# Patient Record
Sex: Male | Born: 1955 | Race: White | Hispanic: No | Marital: Married | Smoking: Current every day smoker
Health system: Southern US, Community
[De-identification: ages and names within clinical notes are randomized; demographics above are authoritative.]

## PROBLEM LIST (undated history)

## (undated) DIAGNOSIS — R972 Elevated prostate specific antigen [PSA]: Secondary | ICD-10-CM

## (undated) DIAGNOSIS — K589 Irritable bowel syndrome without diarrhea: Secondary | ICD-10-CM

## (undated) DIAGNOSIS — E785 Hyperlipidemia, unspecified: Secondary | ICD-10-CM

## (undated) DIAGNOSIS — G8929 Other chronic pain: Secondary | ICD-10-CM

## (undated) DIAGNOSIS — M549 Dorsalgia, unspecified: Secondary | ICD-10-CM

## (undated) DIAGNOSIS — C61 Malignant neoplasm of prostate: Secondary | ICD-10-CM

## (undated) DIAGNOSIS — K219 Gastro-esophageal reflux disease without esophagitis: Secondary | ICD-10-CM

## (undated) DIAGNOSIS — K2270 Barrett's esophagus without dysplasia: Secondary | ICD-10-CM

## (undated) DIAGNOSIS — C801 Malignant (primary) neoplasm, unspecified: Secondary | ICD-10-CM

## (undated) DIAGNOSIS — C679 Malignant neoplasm of bladder, unspecified: Secondary | ICD-10-CM

## (undated) HISTORY — PX: OTHER SURGICAL HISTORY: SHX169

## (undated) HISTORY — DX: Malignant neoplasm of bladder, unspecified: C67.9

## (undated) HISTORY — PX: TONSILLECTOMY AND ADENOIDECTOMY: SUR1326

---

## 2002-03-01 HISTORY — PX: OTHER SURGICAL HISTORY: SHX169

## 2013-04-25 DIAGNOSIS — M5430 Sciatica, unspecified side: Secondary | ICD-10-CM | POA: Insufficient documentation

## 2013-04-25 DIAGNOSIS — R12 Heartburn: Secondary | ICD-10-CM | POA: Insufficient documentation

## 2013-05-15 DIAGNOSIS — F17200 Nicotine dependence, unspecified, uncomplicated: Secondary | ICD-10-CM | POA: Insufficient documentation

## 2014-03-01 DIAGNOSIS — C679 Malignant neoplasm of bladder, unspecified: Secondary | ICD-10-CM

## 2014-03-01 HISTORY — DX: Malignant neoplasm of bladder, unspecified: C67.9

## 2015-01-27 ENCOUNTER — Emergency Department (HOSPITAL_COMMUNITY)
Admission: EM | Admit: 2015-01-27 | Discharge: 2015-01-27 | Disposition: A | Discharge status: Home / Self Care | Payer: 59 | Attending: Emergency Medicine | Admitting: Emergency Medicine

## 2015-01-27 ENCOUNTER — Encounter (HOSPITAL_COMMUNITY): Payer: Self-pay | Admitting: *Deleted

## 2015-01-27 ENCOUNTER — Other Ambulatory Visit: Payer: Self-pay | Admitting: Urology

## 2015-01-27 DIAGNOSIS — Z79899 Other long term (current) drug therapy: Secondary | ICD-10-CM | POA: Insufficient documentation

## 2015-01-27 DIAGNOSIS — E785 Hyperlipidemia, unspecified: Secondary | ICD-10-CM | POA: Insufficient documentation

## 2015-01-27 DIAGNOSIS — Z7982 Long term (current) use of aspirin: Secondary | ICD-10-CM | POA: Insufficient documentation

## 2015-01-27 DIAGNOSIS — Z8719 Personal history of other diseases of the digestive system: Secondary | ICD-10-CM | POA: Insufficient documentation

## 2015-01-27 DIAGNOSIS — G8929 Other chronic pain: Secondary | ICD-10-CM | POA: Insufficient documentation

## 2015-01-27 DIAGNOSIS — M545 Low back pain: Secondary | ICD-10-CM | POA: Diagnosis not present

## 2015-01-27 DIAGNOSIS — R1030 Lower abdominal pain, unspecified: Secondary | ICD-10-CM | POA: Diagnosis not present

## 2015-01-27 DIAGNOSIS — F17210 Nicotine dependence, cigarettes, uncomplicated: Secondary | ICD-10-CM | POA: Insufficient documentation

## 2015-01-27 DIAGNOSIS — R319 Hematuria, unspecified: Secondary | ICD-10-CM | POA: Insufficient documentation

## 2015-01-27 HISTORY — DX: Irritable bowel syndrome, unspecified: K58.9

## 2015-01-27 HISTORY — DX: Other chronic pain: G89.29

## 2015-01-27 HISTORY — DX: Barrett's esophagus without dysplasia: K22.70

## 2015-01-27 HISTORY — DX: Hyperlipidemia, unspecified: E78.5

## 2015-01-27 HISTORY — DX: Dorsalgia, unspecified: M54.9

## 2015-01-27 LAB — COMPREHENSIVE METABOLIC PANEL
ALK PHOS: 53 U/L (ref 38–126)
ALT: 23 U/L (ref 17–63)
ANION GAP: 10 (ref 5–15)
AST: 20 U/L (ref 15–41)
Albumin: 4.9 g/dL (ref 3.5–5.0)
BUN: 12 mg/dL (ref 6–20)
CALCIUM: 9.9 mg/dL (ref 8.9–10.3)
CO2: 26 mmol/L (ref 22–32)
Chloride: 100 mmol/L — ABNORMAL LOW (ref 101–111)
Creatinine, Ser: 1.33 mg/dL — ABNORMAL HIGH (ref 0.61–1.24)
GFR calc non Af Amer: 57 mL/min — ABNORMAL LOW (ref >60)
Glucose, Bld: 102 mg/dL — ABNORMAL HIGH (ref 65–99)
POTASSIUM: 4.1 mmol/L (ref 3.5–5.1)
SODIUM: 136 mmol/L (ref 135–145)
Total Bilirubin: 0.7 mg/dL (ref 0.3–1.2)
Total Protein: 7.9 g/dL (ref 6.5–8.1)

## 2015-01-27 LAB — URINE MICROSCOPIC-ADD ON

## 2015-01-27 LAB — CBC WITH DIFFERENTIAL/PLATELET
BASOS PCT: 0 %
Basophils Absolute: 0 10*3/uL (ref 0.0–0.1)
EOS ABS: 0.2 10*3/uL (ref 0.0–0.7)
Eosinophils Relative: 2 %
HCT: 54.4 % — ABNORMAL HIGH (ref 39.0–52.0)
HEMOGLOBIN: 20 g/dL — AB (ref 13.0–17.0)
LYMPHS ABS: 1.7 10*3/uL (ref 0.7–4.0)
Lymphocytes Relative: 20 %
MCH: 33.2 pg (ref 26.0–34.0)
MCHC: 36.8 g/dL — AB (ref 30.0–36.0)
MCV: 90.4 fL (ref 78.0–100.0)
MONO ABS: 0.5 10*3/uL (ref 0.1–1.0)
Monocytes Relative: 6 %
NEUTROS ABS: 6.1 10*3/uL (ref 1.7–7.7)
Neutrophils Relative %: 72 %
PLATELETS: 206 10*3/uL (ref 150–400)
RBC: 6.02 MIL/uL — ABNORMAL HIGH (ref 4.22–5.81)
RDW: 13.1 % (ref 11.5–15.5)
WBC: 8.5 10*3/uL (ref 4.0–10.5)

## 2015-01-27 LAB — URINALYSIS, ROUTINE W REFLEX MICROSCOPIC
BILIRUBIN URINE: NEGATIVE
GLUCOSE, UA: 100 mg/dL — AB
KETONES UR: 15 mg/dL — AB
Nitrite: NEGATIVE
PH: 7 (ref 5.0–8.0)
Specific Gravity, Urine: 1.022 (ref 1.005–1.030)

## 2015-01-27 LAB — LIPASE, BLOOD: Lipase: 32 U/L (ref 11–51)

## 2015-01-27 NOTE — Discharge Instructions (Signed)
1. Medications: usual home medications 2. Treatment: rest, drink plenty of fluids 3. Follow Up: please followup with urology today (go right to [facility] office after discharge) for discussion of your diagnoses and further evaluation after today's visit; please return to the ER for severe pain, persistent bleeding, unable to urinate, new or worsening symptoms   Hematuria, Adult Hematuria is blood in your urine. It can be caused by a bladder infection, kidney infection, prostate infection, kidney stone, or cancer of your urinary tract. Infections can usually be treated with medicine, and a kidney stone usually will pass through your urine. If neither of these is the cause of your hematuria, further workup to find out the reason may be needed. It is very important that you tell your health care provider about any blood you see in your urine, even if the blood stops without treatment or happens without causing pain. Blood in your urine that happens and then stops and then happens again can be a symptom of a very serious condition. Also, pain is not a symptom in the initial stages of many urinary cancers. HOME CARE INSTRUCTIONS   Drink lots of fluid, 3-4 quarts a day. If you have been diagnosed with an infection, cranberry juice is especially recommended, in addition to large amounts of water.  Avoid caffeine, tea, and carbonated beverages because they tend to irritate the bladder.  Avoid alcohol because it may irritate the prostate.  Take all medicines as directed by your health care provider.  If you were prescribed an antibiotic medicine, finish it all even if you start to feel better.  If you have been diagnosed with a kidney stone, follow your health care provider's instructions regarding straining your urine to catch the stone.  Empty your bladder often. Avoid holding urine for long periods of time.  After a bowel movement, women should cleanse front to back. Use each tissue only  once.  Empty your bladder before and after sexual intercourse if you are a male. SEEK MEDICAL CARE IF:  You develop back pain.  You have a fever.  You have a feeling of sickness in your stomach (nausea) or vomiting.  Your symptoms are not better in 3 days. Return sooner if you are getting worse. SEEK IMMEDIATE MEDICAL CARE IF:   You develop severe vomiting and are unable to keep the medicine down.  You develop severe back or abdominal pain despite taking your medicines.  You begin passing a large amount of blood or clots in your urine.  You feel extremely weak or faint, or you pass out. MAKE SURE YOU:   Understand these instructions.  Will watch your condition.  Will get help right away if you are not doing well or get worse.   This information is not intended to replace advice given to you by your health care provider. Make sure you discuss any questions you have with your health care provider.   Document Released: 02/15/2005 Document Revised: 03/08/2014 Document Reviewed: 10/16/2012 Elsevier Interactive Patient Education [employer].

## 2015-01-27 NOTE — ED Provider Notes (Signed)
CSN: [id number]     Arrival date & time 01/27/15  1011 History   First MD Initiated Contact with Patient 01/27/15 1017     Chief Complaint  Patient presents with  . Hematuria    HPI   Gregory Good is a 59 y.o. male with a PMH of IBS, Barrett's esophagus, tobacco use who presents to the ED with hematuria, which he states started Friday. He reports his symptoms initially resolved Saturday, however recurred last night. He reports he has been passing large blood clots in his urine. He denies exacerbating or alleviating factors. He states he has never experienced this before. He reports associated mild lower abdominal and lower back pain. He denies fever, chills, chest pain, shortness of breath, N/V/D/C, dysuria, urgency, frequency, dizziness, lightheadedness.   Past Medical History  Diagnosis Date  . Barrett's esophagus   . IBS (irritable bowel syndrome)   . Chronic back pain   . Hyperlipidemia    History reviewed. No pertinent past surgical history. History reviewed. No pertinent family history. Social History  Substance Use Topics  . Smoking status: Current Every Day Smoker -- 0.50 packs/day    Types: Cigarettes  . Smokeless tobacco: Never Used  . Alcohol Use: No     Review of Systems  Constitutional: Negative for fever and chills.  Respiratory: Negative for shortness of breath.   Cardiovascular: Negative for chest pain.  Gastrointestinal: Positive for abdominal pain. Negative for nausea, vomiting, diarrhea, constipation and blood in stool.  Genitourinary: Positive for hematuria. Negative for dysuria, urgency and frequency.  Musculoskeletal: Positive for back pain.  Neurological: Negative for dizziness and light-headedness.  All other systems reviewed and are negative.     Allergies  Review of patient's allergies indicates no known allergies.  Home Medications   Prior to Admission medications   Medication Sig Start Date End Date Taking? Authorizing Provider   aspirin EC 81 MG tablet Take 81 mg by mouth daily.   Yes Historical Provider, MD  atorvastatin (LIPITOR) 40 MG tablet Take 40 mg by mouth daily.   Yes Historical Provider, MD  diazepam (VALIUM) 10 MG tablet Take 10 mg by mouth at bedtime as needed for anxiety or sleep.  12/31/14  Yes Historical Provider, MD  esomeprazole (NEXIUM) 20 MG capsule Take 20 mg by mouth 2 (two) times daily before a meal.  01/16/15  Yes Historical Provider, MD  HYDROcodone-acetaminophen (NORCO) 10-325 MG tablet Take 1 tablet by mouth every 6 (six) hours as needed for moderate pain or severe pain.    Yes Historical Provider, MD  testosterone cypionate (DEPOTESTOSTERONE CYPIONATE) 200 MG/ML injection Inject 200 mg into the muscle once a week. thursdays 01/22/15  Yes Historical Provider, MD    BP 136/85 mmHg  Pulse 81  Temp(Src) 98.1 F (36.7 C)  Ht 5\' 10"  (1.778 m)  Wt 88.451 kg  BMI 27.98 kg/m2  SpO2 97% Physical Exam  Constitutional: He is oriented to person, place, and time. He appears well-developed and well-nourished. No distress.  HENT:  Head: Normocephalic and atraumatic.  Right Ear: External ear normal.  Left Ear: External ear normal.  Nose: Nose normal.  Mouth/Throat: Uvula is midline, oropharynx is clear and moist and mucous membranes are normal.  Eyes: Conjunctivae, EOM and lids are normal. Pupils are equal, round, and reactive to light. Right eye exhibits no discharge. Left eye exhibits no discharge. No scleral icterus.  Neck: Normal range of motion. Neck supple.  Cardiovascular: Normal rate, regular rhythm, normal heart sounds, intact distal  pulses and normal pulses.   Pulmonary/Chest: Effort normal and breath sounds normal. No respiratory distress. He has no wheezes. He has no rales.  Abdominal: Soft. Normal appearance and bowel sounds are normal. He exhibits no distension and no mass. There is tenderness. There is no rigidity, no rebound and no guarding.  Mild TTP in suprapubic region. No rebound,  guarding, or masses.  Musculoskeletal: Normal range of motion. He exhibits no edema or tenderness.  Neurological: He is alert and oriented to person, place, and time. He has normal strength. No sensory deficit.  Skin: Skin is warm, dry and intact. No rash noted. He is not diaphoretic. No erythema. No pallor.  Psychiatric: He has a normal mood and affect. His speech is normal and behavior is normal.  Nursing note and vitals reviewed.   ED Course  Procedures (including critical care time)  Labs Review Labs Reviewed  URINALYSIS, ROUTINE W REFLEX MICROSCOPIC (NOT AT [facility]) - Abnormal; Notable for the following:    Color, Urine RED (*)    APPearance TURBID (*)    Glucose, UA 100 (*)    Hgb urine dipstick LARGE (*)    Ketones, ur 15 (*)    Protein, ur >300 (*)    Leukocytes, UA SMALL (*)    All other components within normal limits  CBC WITH DIFFERENTIAL/PLATELET - Abnormal; Notable for the following:    RBC 6.02 (*)    Hemoglobin 20.0 (*)    HCT 54.4 (*)    MCHC 36.8 (*)    All other components within normal limits  COMPREHENSIVE METABOLIC PANEL - Abnormal; Notable for the following:    Chloride 100 (*)    Glucose, Bld 102 (*)    Creatinine, Ser 1.33 (*)    GFR calc non Af Amer 57 (*)    All other components within normal limits  URINE MICROSCOPIC-ADD ON - Abnormal; Notable for the following:    Squamous Epithelial / LPF FIELD OBSCURED BY RBC'S (*)    Bacteria, UA FIELD OBSCURED BY RBC'S (*)    All other components within normal limits  URINE CULTURE  LIPASE, BLOOD    Imaging Review No results found.   I have personally reviewed and evaluated these images and lab results as part of my medical decision-making.   EKG Interpretation None      MDM   Final diagnoses:  Hematuria    59 year old male presents with hematuria, which he states started Friday. Reports associated lower abdominal and lower back pain. States he has back pain at baseline, and denies change in  symptoms.  Patient is afebrile. Vital signs stable. Heart regular rate and rhythm. Lungs clear to auscultation bilaterally. Mild tenderness to palpation in suprapubic region. No rebound, guarding, or masses.  CBC remarkable for hemoglobin 20. CMP with creatinine 1.33, no records available to compare with patient's baseline. Lipase within normal limits. UA remarkable for large hemoglobin, small leukocytes. Microscopic with TNTC RBC, RBC obscuring field. No evidence of urinary tract infection.  Patient is hemodynamically stable, urology consulted for follow-up. Spoke with Dr. Jeffie Pollock, who advised the patient should come to his office upon discharge from the ED today to be evaluated. Discussed follow-up with patient, who is in agreement with plan. Patient is well-appearing, feel he is stable for discharge at this time.   BP 136/85 mmHg  Pulse 81  Temp(Src) 98.1 F (36.7 C)  Ht 5\' 10"  (1.778 m)  Wt 88.451 kg  BMI 27.98 kg/m2  SpO2 97%  Marella Chimes, PA-C 01/27/15 [place], MD 01/27/15 2040

## 2015-01-27 NOTE — ED Notes (Signed)
Pt is in stable condition upon d/c and ambulates from ED. 

## 2015-01-27 NOTE — H&P (Signed)
Active Problems Problems  1. Gross hematuria (R31.0) 2. 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 The bleeding cleared through the weekend but came back yesterday with clots and he continued to have blood today. He is voiding q15 minutes and feels empty with a decent stream. He has had right low back and suprapubic pain. He has had no dysuria. He has no fever or nausea. He has no prior GU history.   Past Medical History Problems  1. History of esophageal reflux (Z87.19) 2. History of hypercholesterolemia (Z86.39) 3. History of hypogonadism (Z86.39) 4. History of Lumbar herniated disc (M51.26)  Surgical History Problems  1. History of Hip Surgery  Current Meds 1. Aspirin 81 MG TABS;  Therapy: (Recorded:28Nov2016) to Recorded 2. Lipitor 40 MG Oral Tablet;  Therapy: (Recorded:28Nov2016) to Recorded 3. NexIUM 20 MG Oral Capsule Delayed Release;  Therapy: (Recorded:28Nov2016) to Recorded 4. Norco TABS; TAKE TABLET  PRN;  Therapy: (Recorded:28Nov2016) to Recorded 5. Testosterone Cypionate 200 MG/ML OIL;  Therapy: (Recorded:28Nov2016) to Recorded 6. Valium 10 MG Oral Tablet; TAKE TABLET  PRN;  Therapy: (Recorded:28Nov2016) to Recorded  Allergies Medication  1. No Known Drug Allergies  Family History Problems  1. Family history of malignant neoplasm of kidney (Z80.51) : Father  Social History Problems    Denied: History of Alcohol use   Caffeine use (F15.90)   1   Current every day smoker (F17.200)   smokes 1/2 ppd for 37 years   Father deceased   Married   Number of children   2 daughters   Occupation   [employer]  Review of Systems Genitourinary, constitutional, skin, eye, otolaryngeal, hematologic/lymphatic, cardiovascular, pulmonary, endocrine, musculoskeletal, gastrointestinal, neurological and psychiatric system(s) were reviewed and pertinent  findings if present are noted and are otherwise negative.  Genitourinary: urinary frequency, dysuria and hematuria.  Musculoskeletal: back pain.    Vitals Vital Signs [Data Includes: Last 1 Day]  Recorded: [id number] 02:34PM  Height: 5 ft 10 in Weight: 195 lb  BMI Calculated: 27.98 BSA Calculated: 2.06 Blood Pressure: 144 / 86 Temperature: 97.4 F Heart Rate: 81  Physical Exam Constitutional: Well nourished and well developed . No acute distress.  ENT:. The ears and nose are normal in appearance.  Neck: The appearance of the neck is normal and no neck mass is present.  Pulmonary: No respiratory distress and normal respiratory rhythm and effort.  Cardiovascular: Heart rate and rhythm are normal . No peripheral edema.  Abdomen: The abdomen is soft and nontender. No masses are palpated. moderate bilateral CVA tenderness. No hernias are palpable. No hepatosplenomegaly noted.  Rectal: Rectal exam demonstrates normal sphincter tone, no tenderness and no masses. Estimated prostate size is 2+. The prostate has no nodularity and is not tender. The left seminal vesicle is nonpalpable. The right seminal vesicle is nonpalpable. The perineum is normal on inspection.  Genitourinary: Examination of the penis demonstrates no discharge, no masses, no lesions and a normal meatus. The scrotum is without lesions. The right epididymis is palpably normal and non-tender. The left epididymis is palpably normal and non-tender. The right testis is non-tender and without masses. The left testis is non-tender and without masses.  Lymphatics: The femoral and inguinal nodes are not enlarged or tender.  Skin: Normal skin turgor, no visible rash and no visible skin lesions.  Neuro/Psych:. Mood and affect are appropriate.    Results/Data Urine Belva Bertin Includes:  Last 1 Day]   [id number]  COLOR RED   APPEARANCE CLOUDY   SPECIFIC GRAVITY 1.015   pH 6.5   GLUCOSE 1+   BILIRUBIN NEGATIVE   KETONE 3+   BLOOD 3+   PROTEIN  3+   NITRITE POSITIVE   LEUKOCYTE ESTERASE 3+   SQUAMOUS EPITHELIAL/HPF NONE SEEN HPF  WBC NONE SEEN WBC/HPF  RBC PACKED RBC/HPF  BACTERIA NONE SEEN HPF  CRYSTALS NONE SEEN HPF  CASTS NONE SEEN LPF  Yeast NONE SEEN HPF   Old records or history reviewed: ER records and labs reviewed.  The following images/tracing/specimen were independently visualized:  CT films reviewed.  The following clinical lab reports were reviewed:  UA reviewed. Selected Results  AU CT-HEMATURIA PROTOCOL [id number] 12:00AM Irine Seal   Test Name Result Flag Reference  AU CT-HEMATURIA PROTOCOL (Report)    ** RADIOLOGY REPORT BY [facility] **   CLINICAL DATA: Gross hematuria for 4 days.  EXAM: CT ABDOMEN AND PELVIS WITHOUT AND WITH CONTRAST  TECHNIQUE: Multidetector CT imaging of the abdomen and pelvis was performed following the standard protocol before and following the bolus administration of intravenous contrast.  CONTRAST: 125 cc Isovue-300  COMPARISON: None.  FINDINGS: Lower chest: The lung bases are clear of acute process. No pleural effusion or pulmonary lesions. The heart is normal in size. No pericardial effusion. The distal esophagus and aorta are unremarkable.  Hepatobiliary: Numerous low-attenuation hepatic lesions consistent with benign hepatic cysts. No worrisome hepatic lesions or intrahepatic biliary dilatation. The gallbladder is normal. No common bile duct dilatation.  Pancreas: No mass, inflammation or ductal dilatation.  Spleen: Normal size. No focal lesions.  Adrenals/Urinary Tract: The adrenal glands are normal.  Both kidneys are normal. No renal or ureteral calculi. Both kidneys demonstrated normal enhancement/ perfusion. No worrisome renal lesions. The delayed images do not demonstrate any collecting system abnormalities. Both ureters are normal.  There is a large irregular filling defect in the left side of the bladder. Some of these areas are  hyperdense on precontrast images measuring between 46 and 55 Hounsfield units suggesting that it may be clot/hematoma. However, along the lateral margin of the bladder there appears to be in the area of fairly intense enhancement (102 Hounsfield units) which is worrisome for tumor.  Stomach/Bowel: The stomach, duodenum, small bowel and colon are grossly normal without oral contrast. No inflammatory changes, mass lesions or obstructive findings. The terminal ileum and appendix are normal.  Vascular/Lymphatic: No mesenteric or retroperitoneal mass or adenopathy. Small scattered lymph nodes are noted. The aorta is normal in caliber. The branch vessels are normal. The major venous structures are patent. 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  Other: The prostate gland is slightly enlarged. The seminal vesicles appear normal no pelvic lymphadenopathy. No inguinal lymphadenopathy.  Musculoskeletal: No significant bony findings. No lytic or sclerotic bone lesions are identified.  IMPRESSION: 1. Irregular filling defect in the left side of the bladder measuring approximately 2.6 cm. This is worrisome for a bladder neoplasm with probable surrounding hematoma. Recommend cystoscopic evaluation. 2. No renal, ureteral or bladder calculi. No renal or ureteral mass. 3. No CT findings to suggest metastatic disease. No adenopathy. 4. Simple appearing hepatic cysts. 5. 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   Electronically Signed  By: Marijo Sanes M.D.  On: 01/27/2015 16:31   Assessment Assessed  1. Gross hematuria (R31.0) 2. 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   Plan Gross hematuria  1. AU CT-HEMATURIA PROTOCOL; Status:Resulted - Requires Verification;   [id number] 12:00AM Health Maintenance  2. UA With REFLEX; [Do Not Release]; Status:Resulted - Requires Verification;   [id number]  02:04PM  I am going to try and get him set up for a TURBT with instillation of [facility] tomorrow and reviewed the risks of bleeding, infection, bladder wall injury with need for surgery or prolonged catheter drainage, chemical cystitis, need for restaging, thrombotic events and anesthetic complications.  Urine culture ordered.   Discussion/Summary CC: Dr. Rutha Bouchard.

## 2015-01-27 NOTE — ED Notes (Signed)
Pt states on Friday he had a small blood clot in his urine that went away until last night. Pt states last night his urine began getting more bloody and this morning he is only passing what looks like blood clots and is now having abdominal and flank pain.

## 2015-01-28 ENCOUNTER — Encounter (HOSPITAL_COMMUNITY): Payer: Self-pay | Admitting: *Deleted

## 2015-01-28 ENCOUNTER — Encounter (HOSPITAL_COMMUNITY)
Admission: RE | Disposition: A | Discharge status: Home / Self Care | Payer: Self-pay | Source: Ambulatory Visit | Attending: Urology

## 2015-01-28 ENCOUNTER — Ambulatory Visit (HOSPITAL_COMMUNITY)
Admission: RE | Admit: 2015-01-28 | Discharge: 2015-01-28 | Disposition: A | Discharge status: Home / Self Care | Payer: 59 | Source: Ambulatory Visit | Attending: Urology | Admitting: Urology

## 2015-01-28 ENCOUNTER — Ambulatory Visit (HOSPITAL_COMMUNITY): Payer: 59 | Admitting: Anesthesiology

## 2015-01-28 ENCOUNTER — Other Ambulatory Visit: Payer: Self-pay | Admitting: Urology

## 2015-01-28 DIAGNOSIS — N3289 Other specified disorders of bladder: Secondary | ICD-10-CM | POA: Insufficient documentation

## 2015-01-28 DIAGNOSIS — Z7982 Long term (current) use of aspirin: Secondary | ICD-10-CM | POA: Insufficient documentation

## 2015-01-28 DIAGNOSIS — E7800 Pure hypercholesterolemia, unspecified: Secondary | ICD-10-CM | POA: Diagnosis not present

## 2015-01-28 DIAGNOSIS — K219 Gastro-esophageal reflux disease without esophagitis: Secondary | ICD-10-CM | POA: Diagnosis not present

## 2015-01-28 DIAGNOSIS — F17210 Nicotine dependence, cigarettes, uncomplicated: Secondary | ICD-10-CM | POA: Insufficient documentation

## 2015-01-28 DIAGNOSIS — C672 Malignant neoplasm of lateral wall of bladder: Secondary | ICD-10-CM | POA: Diagnosis not present

## 2015-01-28 HISTORY — PX: TRANSURETHRAL RESECTION OF BLADDER TUMOR: SHX2575

## 2015-01-28 HISTORY — PX: CYSTOSCOPY: SHX5120

## 2015-01-28 HISTORY — DX: Gastro-esophageal reflux disease without esophagitis: K21.9

## 2015-01-28 LAB — URINE CULTURE
Culture: NO GROWTH
Special Requests: NORMAL

## 2015-01-28 SURGERY — CYSTOSCOPY
Anesthesia: General

## 2015-01-28 MED ORDER — FENTANYL CITRATE (PF) 100 MCG/2ML IJ SOLN
INTRAMUSCULAR | Status: AC
  Filled 2015-01-28: qty 2

## 2015-01-28 MED ORDER — FENTANYL CITRATE (PF) 100 MCG/2ML IJ SOLN
INTRAMUSCULAR | Status: DC | PRN
  Administered 2015-01-28: 100 ug via INTRAVENOUS

## 2015-01-28 MED ORDER — DEXAMETHASONE SODIUM PHOSPHATE 10 MG/ML IJ SOLN
INTRAMUSCULAR | Status: AC
  Filled 2015-01-28: qty 1

## 2015-01-28 MED ORDER — LACTATED RINGERS IV SOLN: INTRAVENOUS | Status: DC

## 2015-01-28 MED ORDER — ONDANSETRON HCL 4 MG/2ML IJ SOLN
INTRAMUSCULAR | Status: DC | PRN
  Administered 2015-01-28: 4 mg via INTRAVENOUS

## 2015-01-28 MED ORDER — CIPROFLOXACIN IN D5W 400 MG/200ML IV SOLN
INTRAVENOUS | Status: AC
  Filled 2015-01-28: qty 200

## 2015-01-28 MED ORDER — FENTANYL CITRATE (PF) 100 MCG/2ML IJ SOLN
25.0000 ug | INTRAMUSCULAR | Status: DC | PRN
  Administered 2015-01-28 (×3): 50 ug via INTRAVENOUS

## 2015-01-28 MED ORDER — SUGAMMADEX SODIUM 200 MG/2ML IV SOLN
INTRAVENOUS | Status: DC | PRN
  Administered 2015-01-28: 200 mg via INTRAVENOUS

## 2015-01-28 MED ORDER — OXYBUTYNIN CHLORIDE ER 10 MG PO TB24
10.0000 mg | ORAL_TABLET | Freq: Every day | ORAL | Status: DC
  Administered 2015-01-28: 10 mg via ORAL
  Filled 2015-01-28 (×2): qty 1

## 2015-01-28 MED ORDER — ROCURONIUM BROMIDE 100 MG/10ML IV SOLN
INTRAVENOUS | Status: DC | PRN
  Administered 2015-01-28: 10 mg via INTRAVENOUS
  Administered 2015-01-28: 30 mg via INTRAVENOUS

## 2015-01-28 MED ORDER — CIPROFLOXACIN IN D5W 400 MG/200ML IV SOLN
400.0000 mg | INTRAVENOUS | Status: AC
  Administered 2015-01-28: 400 mg via INTRAVENOUS

## 2015-01-28 MED ORDER — PROPOFOL 10 MG/ML IV BOLUS
INTRAVENOUS | Status: AC
  Filled 2015-01-28: qty 20

## 2015-01-28 MED ORDER — LIDOCAINE HCL (CARDIAC) 20 MG/ML IV SOLN
INTRAVENOUS | Status: DC | PRN
  Administered 2015-01-28: 100 mg via INTRAVENOUS

## 2015-01-28 MED ORDER — HYOSCYAMINE SULFATE 0.125 MG SL SUBL: 0.2500 mg | SUBLINGUAL_TABLET | SUBLINGUAL | Status: DC | PRN

## 2015-01-28 MED ORDER — MIDAZOLAM HCL 5 MG/5ML IJ SOLN
INTRAMUSCULAR | Status: DC | PRN
  Administered 2015-01-28: 2 mg via INTRAVENOUS

## 2015-01-28 MED ORDER — LACTATED RINGERS IV SOLN
INTRAVENOUS | Status: DC | PRN
  Administered 2015-01-28: 10:00:00 via INTRAVENOUS

## 2015-01-28 MED ORDER — SUGAMMADEX SODIUM 200 MG/2ML IV SOLN
INTRAVENOUS | Status: AC
  Filled 2015-01-28: qty 2

## 2015-01-28 MED ORDER — LIDOCAINE HCL (CARDIAC) 20 MG/ML IV SOLN
INTRAVENOUS | Status: AC
  Filled 2015-01-28: qty 5

## 2015-01-28 MED ORDER — HYDROCODONE-ACETAMINOPHEN 5-325 MG PO TABS: 1.0000 | ORAL_TABLET | Freq: Four times a day (QID) | ORAL | Status: DC | PRN

## 2015-01-28 MED ORDER — PROPOFOL 10 MG/ML IV BOLUS
INTRAVENOUS | Status: DC | PRN
  Administered 2015-01-28: 200 mg via INTRAVENOUS

## 2015-01-28 MED ORDER — DEXAMETHASONE SODIUM PHOSPHATE 10 MG/ML IJ SOLN
INTRAMUSCULAR | Status: DC | PRN
  Administered 2015-01-28: 10 mg via INTRAVENOUS

## 2015-01-28 MED ORDER — SODIUM CHLORIDE 0.9 % IR SOLN
Status: DC | PRN
  Administered 2015-01-28: 8000 mL via INTRAVESICAL

## 2015-01-28 MED ORDER — ONDANSETRON HCL 4 MG/2ML IJ SOLN
INTRAMUSCULAR | Status: AC
  Filled 2015-01-28: qty 2

## 2015-01-28 MED ORDER — SUCCINYLCHOLINE CHLORIDE 20 MG/ML IJ SOLN
INTRAMUSCULAR | Status: DC | PRN
  Administered 2015-01-28: 100 mg via INTRAVENOUS

## 2015-01-28 MED ORDER — MITOMYCIN CHEMO FOR BLADDER INSTILLATION 40 MG
40.0000 mg | Freq: Once | INTRAVENOUS | Status: AC
  Administered 2015-01-28: 40 mg via INTRAVESICAL
  Filled 2015-01-28: qty 40

## 2015-01-28 MED ORDER — MIDAZOLAM HCL 2 MG/2ML IJ SOLN
INTRAMUSCULAR | Status: AC
  Filled 2015-01-28: qty 2

## 2015-01-28 MED ORDER — LACTATED RINGERS IV SOLN
INTRAVENOUS | Status: DC
  Administered 2015-01-28: 13:00:00 via INTRAVENOUS

## 2015-01-28 SURGICAL SUPPLY — 19 items
BAG URINE DRAINAGE (UROLOGICAL SUPPLIES) IMPLANT
BAG URO CATCHER STRL LF (DRAPE) ×3 IMPLANT
CATH FOLEY 2WAY SLVR 30CC 20FR (CATHETERS) ×3 IMPLANT
CATH FOLEY 3WAY 30CC 22FR (CATHETERS) IMPLANT
CATH ROBINSON RED A/P 16FR (CATHETERS) IMPLANT
CATH URET 5FR 28IN OPEN ENDED (CATHETERS) ×3 IMPLANT
CLOTH BEACON ORANGE TIMEOUT ST (SAFETY) ×3 IMPLANT
GLOVE SURG SS PI 8.0 STRL IVOR (GLOVE) IMPLANT
GOWN STRL REUS W/TWL XL LVL3 (GOWN DISPOSABLE) ×3 IMPLANT
HOLDER FOLEY CATH W/STRAP (MISCELLANEOUS) IMPLANT
KIT ASPIRATION TUBING (SET/KITS/TRAYS/PACK) ×3 IMPLANT
LOOP CUT BIPOLAR 24F LRG (ELECTROSURGICAL) ×3 IMPLANT
MANIFOLD NEPTUNE II (INSTRUMENTS) ×3 IMPLANT
PACK CYSTO (CUSTOM PROCEDURE TRAY) ×3 IMPLANT
SUT ETHILON 3 0 PS 1 (SUTURE) IMPLANT
SYR 30ML LL (SYRINGE) ×3 IMPLANT
SYRINGE IRR TOOMEY STRL 70CC (SYRINGE) IMPLANT
TUBING CONNECTING 10 (TUBING) ×2 IMPLANT
TUBING CONNECTING 10' (TUBING) ×1

## 2015-01-28 NOTE — Discharge Instructions (Signed)
Instructions:  You may have been discharged with a foley catheter draining urine out of  your bladder. This consists of a rubber tube coming out of your urethra and connects to the drainage tubing and drainage bag. The tube is held in your bladder by a balloon which is inflated using water. Before discharge from the hospital, your nurse will instruct you how to care for your foley catheter. Other times, you may not have needed a catheter at all or your catheter may have been removed prior to discharge.   A foley catheter drains your bladder by gravity. The drainage bag should always be below the level of your bladder. If you are wearing a leg bag, it should be below your waist, and at night, the bag should lay on the floor next to you in bed.   It is normal to see some blood in your urine after a TURBT. This is often due to the foley catheter rubbing on the scab which is trying to form over your prostate or bladder resection site. This can also occur after a TURBT even after the catheter has been removed when urine dislodges the clot during voiding. However, if your urine is the color of tomato juice with quarter-sized clots, this can clog the catheter or prevent you from being able to urinate, and you should call us for instructions. A physician will likely need to evaluate you.  Sometimes, a piece of tissue, or a blood clot can get caught in the foley catheter and cause it not to drain properly. In this case, you may leak urine around the catheter and you may feel like your bladder is full. In this case, you should disconnect the catheter from the drainage bag and flush the catheter with ~30cc of saline (available at [facility]). If this doesnt help, you should come in to be evaluated.  Other times, even though your foley catheter is draining well, you have the feeling that you have to urinate, and you may leak around your catheter during painful bladder spasms. If this is the case, a medication called  oxybutynin (or Ditropan) may help.  [facility] for fever >101.37F by mouth, uncontrolled nausea or vomiting, pain uncontrolled by medication, foley catheter problems, decreased urine output; also, call for any new or concerning symptoms.

## 2015-01-28 NOTE — Anesthesia Procedure Notes (Signed)
Procedure Name: Intubation Date/Time: 01/28/2015 11:22 AM Performed by: Lind Covert Pre-anesthesia Checklist: Patient identified, Emergency Drugs available, Suction available, Patient being monitored and Timeout performed Patient Re-evaluated:Patient Re-evaluated prior to inductionOxygen Delivery Method: Circle system utilized Preoxygenation: Pre-oxygenation with 100% oxygen Intubation Type: IV induction Laryngoscope Size: Mac and 4 Grade View: Grade I Tube type: Oral Tube size: 7.5 mm Number of attempts: 1 Airway Equipment and Method: Stylet Dental Injury: Teeth and Oropharynx as per pre-operative assessment

## 2015-01-28 NOTE — Op Note (Addendum)
Operative Note:  Preoperative Diagnosis:  1. Clot retention 2. Bladder mass  Postoperative Diagnosis:   1. Clot retention 2. 3 cm bladder tumor (medium)  Procedure(s) Performed:   1. Cystourethroscopy 2. Clot evacuation 3. Transurethral resection of 3 cm bladder tumor, medium sized 4. Installation of Mitomycin C (chemotherapeutic agent) in the PACU  Teaching Surgeon:  Irine Seal, MD  Resident Surgeon:  E. Harvel Ricks, MD  Assistant(s):  None  Anesthesia:  General via endotracheal tube  Fluids:  See anesthesia record  Estimated blood loss:  20 mL  Specimens:  Left lateral bladder wall tumor chips sent for final pathology  Cultures:  None  Drains:  20 F 2-way foley catheter with 10 mL sterile water in balloon  Complications:  None  Indications: See prior notes  Findings:   1. Medium to large amount of organized old clot present within bladder and attached to left lateral wall tumor. This was resected and evacuated with a tumi syringe. 2. Medium sized (3 cm) low-grade and non-invasive appearing papillary tumor present on the left lateral bladder wall encompassing the left ureteral orifice. No other tumors present within the bladder. The left sided tumor was resected to completion with muscle present in specimen and no evidence of bladder perforation. We did carefully and superficially resect over the left ureteral orifice which was clearly identified following this and vigorously effluxed at the conclusion of the case with no evidence of obstruction of the left ureteral orifice.  Description:  The patient was correctly identified in the preop holding area where written informed consent as well potential risk and complication reviewed. Hee agreed. They were brought back to the operative suite where a preinduction timeout was performed. Once correct information was verified, general anesthesia was induced via endotracheal tube. They were then gently placed into dorsal  lithotomy position with SCDs in place for VTE prophylaxis. They were prepped and draped in the usual sterile fashion and given appropriate preoperative antibiotics. A second timeout was then performed.   We inserted a 45F rigid cystoscope per urethra with copious lubrication and normal saline irrigation running. This demonstrated findings as described above.    We switched to our 26 [place] resectoscope with Beatrix Fetters working element and electrocautery loop with which we performed transurethral resection of the organized clot within the bladder and the underlying bladder tumor.First we resected a portion of the clot and was then able to manipulate it off the tumor. Next, we turned our attention to the resection of the bladder tumor. Paying careful attention to avoid the ureteral orifice's, we carefully resected down to a depth of muscularis propria on the left lateral wall. We achieved satisfactory hemostasis along the way using bipolar electrocautery. After initial debulking we were able to identify the left ureteral orifice which was involved with tumor. We carefully and superficially resected the overlying tumor using cutting current and no coagulation. The left ureteral orifice was clearly identifiable after resecting over it and continued to efflux following this.  Once the resection was complete, we irrigated and evacuated the patient's bladder several times using Toomey syringe and bladder filling and evacuationand all resection chips were sent for Pathology labeled left lateral bladder wall tumor. We then fulgurated theperiphery and base of our resection site circumferentially making sure to avoid the left ureteral orifice.Again we confirmed that the left kidney was effluxing. We then ensuredsatisfactory hemostasis with the bladder relatively empty and irrigation turned off.. At this point, bladder was left full and all instrumentation was removed.  We inserted a 20 French 2 way foley catheter with  10 mL water in the balloon. Catheter was placed to drainage. Draining urine was clear and without clot. The patient was then awoken from anesthesia and taken to recovery area.  Mitomycin C was instilled via the foley catheter in the PACU, allowed to dwell for one hour and drained from the bladder prior to discharge home.  Post Op Plan:   1. Discharge patient home when meets PACU criteria, allows mitomycin C to dwell for 1 hour & then drains out.  Attestation:  Dr. Jeffie Pollock was present and scrubbed for the entirety of the procedure.

## 2015-01-28 NOTE — Anesthesia Postprocedure Evaluation (Signed)
Anesthesia Post Note  Patient: Gregory Good  Procedure(s) Performed: Procedure(s) (LRB): CYSTOSCOPY (N/A) TRANSURETHRAL RESECTION OF BLADDER TUMOR (TURBT) MITOMYCIN-C INSTILLATION (N/A)  Patient location during evaluation: PACU Anesthesia Type: General Level of consciousness: awake and alert Pain management: pain level controlled Vital Signs Assessment: post-procedure vital signs reviewed and stable Respiratory status: spontaneous breathing, nonlabored ventilation, respiratory function stable and patient connected to nasal cannula oxygen Cardiovascular status: blood pressure returned to baseline and stable Postop Assessment: no signs of nausea or vomiting Anesthetic complications: no    Last Vitals:  Filed Vitals:   01/28/15 1315 01/28/15 1330  BP: 126/89 119/76  Pulse: 81 76  Temp:    Resp: 13 16    Last Pain:  Filed Vitals:   01/28/15 1338  PainSc: Asleep                 [name] L

## 2015-01-28 NOTE — Transfer of Care (Signed)
Immediate Anesthesia Transfer of Care Note  Patient: Gregory Good  Procedure(s) Performed: Procedure(s): CYSTOSCOPY (N/A) TRANSURETHRAL RESECTION OF BLADDER TUMOR (TURBT) MITOMYCIN-C INSTILLATION (N/A)  Patient Location: PACU  Anesthesia Type:General  Level of Consciousness: awake, alert  and oriented  Airway & Oxygen Therapy: Patient Spontanous Breathing and Patient connected to face mask oxygen  Post-op Assessment: Report given to RN and Post -op Vital signs reviewed and stable  Post vital signs: Reviewed and stable  Last Vitals:  Filed Vitals:   01/28/15 0828  BP: 141/63  Pulse: 94  Temp: 36.7 C  Resp: 18    Complications: No apparent anesthesia complications

## 2015-01-28 NOTE — Interval H&P Note (Signed)
History and Physical Interval Note:  01/28/2015 11:16 AM  Alice Reichert  has presented today for surgery, with the diagnosis of gross hemateria and probably bladder tumor  The various methods of treatment have been discussed with the patient and family. After consideration of risks, benefits and other options for treatment, the patient has consented to  Procedure(s): CYSTOSCOPY (N/A) TRANSURETHRAL RESECTION OF BLADDER TUMOR (TURBT) MITOMYCIN-C INSTILLATION (N/A) as a surgical intervention .  The patient's history has been reviewed, patient examined, no change in status, stable for surgery.  I have reviewed the patient's chart and labs.  Questions were answered to the patient's satisfaction.     Dhaval Woo J

## 2015-01-28 NOTE — Anesthesia Preprocedure Evaluation (Signed)
Anesthesia Evaluation  Patient identified by MRN, date of birth, ID band Patient awake    Reviewed: Allergy & Precautions, H&P , NPO status , Patient's Chart, lab work & pertinent test results  Airway Mallampati: II  TM Distance: >3 FB Neck ROM: full    Dental no notable dental hx. (+) Dental Advisory Given, Teeth Intact   Pulmonary neg pulmonary ROS, Current Smoker,    Pulmonary exam normal breath sounds clear to auscultation       Cardiovascular Exercise Tolerance: Good negative cardio ROS Normal cardiovascular exam Rhythm:regular Rate:Normal     Neuro/Psych negative neurological ROS  negative psych ROS   GI/Hepatic negative GI ROS, Neg liver ROS, GERD  ,Barrett's esophagus   Endo/Other  negative endocrine ROS  Renal/GU negative Renal ROS  negative genitourinary   Musculoskeletal   Abdominal   Peds  Hematology negative hematology ROS (+)   Anesthesia Other Findings   Reproductive/Obstetrics negative OB ROS                             Anesthesia Physical Anesthesia Plan  ASA: II  Anesthesia Plan: General   Post-op Pain Management:    Induction: Intravenous  Airway Management Planned: LMA  Additional Equipment:   Intra-op Plan:   Post-operative Plan:   Informed Consent: I have reviewed the patients History and Physical, chart, labs and discussed the procedure including the risks, benefits and alternatives for the proposed anesthesia with the patient or authorized representative who has indicated his/her understanding and acceptance.   Dental Advisory Given  Plan Discussed with: CRNA and Surgeon  Anesthesia Plan Comments:         Anesthesia Quick Evaluation

## 2015-01-29 ENCOUNTER — Encounter (HOSPITAL_COMMUNITY): Payer: Self-pay | Admitting: Urology

## 2015-06-06 ENCOUNTER — Encounter: Payer: Self-pay | Admitting: Primary Care

## 2015-06-06 ENCOUNTER — Ambulatory Visit (INDEPENDENT_AMBULATORY_CARE_PROVIDER_SITE_OTHER): Payer: 59 | Admitting: Primary Care

## 2015-06-06 VITALS — BP 126/74 | HR 80 | Temp 97.5°F | Ht 70.0 in | Wt 190.8 lb

## 2015-06-06 DIAGNOSIS — E291 Testicular hypofunction: Secondary | ICD-10-CM

## 2015-06-06 DIAGNOSIS — Z8551 Personal history of malignant neoplasm of bladder: Secondary | ICD-10-CM | POA: Insufficient documentation

## 2015-06-06 DIAGNOSIS — C679 Malignant neoplasm of bladder, unspecified: Secondary | ICD-10-CM

## 2015-06-06 DIAGNOSIS — E349 Endocrine disorder, unspecified: Secondary | ICD-10-CM | POA: Insufficient documentation

## 2015-06-06 DIAGNOSIS — E785 Hyperlipidemia, unspecified: Secondary | ICD-10-CM

## 2015-06-06 DIAGNOSIS — M549 Dorsalgia, unspecified: Secondary | ICD-10-CM

## 2015-06-06 DIAGNOSIS — K219 Gastro-esophageal reflux disease without esophagitis: Secondary | ICD-10-CM | POA: Insufficient documentation

## 2015-06-06 DIAGNOSIS — N529 Male erectile dysfunction, unspecified: Secondary | ICD-10-CM

## 2015-06-06 DIAGNOSIS — G8929 Other chronic pain: Secondary | ICD-10-CM

## 2015-06-06 MED ORDER — TESTOSTERONE CYPIONATE 200 MG/ML IM SOLN: INTRAMUSCULAR | Status: DC

## 2015-06-06 MED ORDER — SILDENAFIL CITRATE 20 MG PO TABS: ORAL_TABLET | ORAL | Status: DC

## 2015-06-06 NOTE — Assessment & Plan Note (Signed)
Long history of. Managed on Nexium 20 mg. History of Barrett's esophagus.

## 2015-06-06 NOTE — Assessment & Plan Note (Signed)
Diagnosed in November 2016, managed with Dr. Jeffie Pollock with [facility].

## 2015-06-06 NOTE — Assessment & Plan Note (Signed)
History of x 1 year. Was managed by prior PCP. Discussed that he will need to have Urology manage as I do not. Refill provided today as he is out, will defer to Urology for further testing and treatment.

## 2015-06-06 NOTE — Assessment & Plan Note (Signed)
Prior history of but improved after changes in diet. Lipids from 2015 reviewed from care everywhere and were stable. Will recheck this coming fall during annual physical.

## 2015-06-06 NOTE — Progress Notes (Signed)
Pre visit review using our clinic review tool, if applicable. No additional management support is needed unless otherwise documented below in the visit note. 

## 2015-06-06 NOTE — Progress Notes (Signed)
Subjective:    Patient ID: Gregory Good, male    DOB: 1955/04/03, 60 y.o.   MRN: [id number]  HPI  Gregory Good is a 60 year old male who presents today to establish care and discuss the problems mentioned below. Will obtain old records. His last annual physical was Fall of 2016.   1) Barrett's Esophagus/GERD: Diagnosed 10+ years ago. Currently managed on Nexium 20 mg. His last endoscopy was several years ago. He will experience severe epigastric burning, espcially when laying down, and belching without his Nexium.   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 He is managed with cortisone injections, Norco, and Valium that is prescribed by neurology in [facility]. (Dr. Jannifer Franklin)  3) Erectyle Disfunction: Managed on Cialis 5 mg for erectile dysfunction. He has difficulty with both obtaining and maintaining an erection. He is interested in switching to Viagra as Cialis is too costly. He had no history of erectile dysfunction prior to his back injury. His neurologist believes his erectile dysfunction was caused by the nerve disruption to his back.   3) Testosterone Deficiency: History of testosterone deficiency since 1 year ago. Currently managed on testosterone cypionate 200 mg/ml injection. He injects 0.75 ml every 1 week. This was managed by his prior PCP. He follows with Urology, Dr. Jeffie Pollock for recent diagnosis of bladder cancer 6 months ago. He is out of his testosterone medication as his last injection was 2 weeks ago. His initial testosterone level was 49 one year ago, and 63 six months ago.  4) Hyperlipidemia: Diagnosed several years ago. His last cholesterol check was 2 years ago. He was once above goal and managed on medication, but could not tolerate taking the medication. He's since improved his diet and endorses a normal lipid panel check 2 years ago.   Review of Systems  Constitutional: Positive for fatigue.  Respiratory: Negative for shortness of breath.     Cardiovascular: Negative for chest pain.  Gastrointestinal:       Moderate GERD.  Genitourinary:       Managed on testosterone replacement.  Musculoskeletal:       Chronic low back pain       Past Medical History  Diagnosis Date  . Barrett's esophagus   . IBS (irritable bowel syndrome)   . Chronic back pain   . Hyperlipidemia   . GERD (gastroesophageal reflux disease)   . Bladder cancer [facility]) 2016    Follows with Urology    Social History   Social History  . Marital Status: Married    Spouse Name: N/A  . Number of Children: N/A  . Years of Education: N/A   Occupational History  . Not on file.   Social History Main Topics  . Smoking status: Light Tobacco Smoker -- 0.50 packs/day for 40 years    Types: Cigarettes  . Smokeless tobacco: Never Used  . Alcohol Use: No  . Drug Use: No  . Sexual Activity: Yes   Other Topics Concern  . Not on file   Social History Narrative   Married.    Works as     Past Surgical History  Procedure Laterality Date  . Left hip surgery      30 years ago for bone spur   . Right knee arthroscopy     . Cystoscopy N/A 01/28/2015    Procedure: CYSTOSCOPY;  Surgeon: Irine Seal, MD;  Location: [facility];  Service: Urology;  Laterality: N/A;  . Transurethral resection of bladder  tumor N/A 01/28/2015    Procedure: TRANSURETHRAL RESECTION OF BLADDER TUMOR (TURBT) MITOMYCIN-C INSTILLATION;  Surgeon: Irine Seal, MD;  Location: [facility];  Service: Urology;  Laterality: N/A;    Family History  Problem Relation Age of Onset  . Kidney cancer Father     No Known Allergies  Current Outpatient Prescriptions on File Prior to Visit  Medication Sig Dispense Refill  . diazepam (VALIUM) 10 MG tablet Take 10 mg by mouth as needed for spasms    . esomeprazole (NEXIUM) 20 MG capsule Take 20 mg by mouth 2 (two) times daily before a meal.     . fexofenadine (ALLEGRA) 30 MG tablet Take 30 mg by mouth daily as needed (for allergies).      No current  facility-administered medications on file prior to visit.    BP 126/74 mmHg  Pulse 80  Temp(Src) 97.5 F (36.4 C) (Oral)  Ht 5\' 10"  (1.778 m)  Wt 190 lb 12.8 oz (86.546 kg)  BMI 27.38 kg/m2  SpO2 97%    Objective:   Physical Exam  Constitutional: He appears well-nourished.  Neck: Neck supple.  Cardiovascular: Normal rate and regular rhythm.   Pulmonary/Chest: Effort normal and breath sounds normal.  Skin: Skin is warm and dry.  Psychiatric: He has a normal mood and affect.          Assessment & Plan:

## 2015-06-06 NOTE — Assessment & Plan Note (Signed)
Long history of and follows with Neurology in [facility]. 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

## 2015-06-06 NOTE — Patient Instructions (Signed)
Schedule a physical with me in the Fall 2017 for a complete physical.  Please discuss your testosterone replacement therapy with Urology as discussed.  It was a pleasure to meet you today! Please don't hesitate to call me with any questions. Welcome to [facility]!

## 2015-06-06 NOTE — Assessment & Plan Note (Signed)
Since nerve damage to lower back. Managed on Cialis, would like to try Viagra as Cialis is too costly. RX of Viagra sent to pharmacy.

## 2015-07-07 ENCOUNTER — Other Ambulatory Visit: Payer: Self-pay | Admitting: Primary Care

## 2015-07-07 ENCOUNTER — Telehealth: Payer: Self-pay | Admitting: Primary Care

## 2015-07-07 NOTE — Telephone Encounter (Signed)
Electronically refill request for   testosterone cypionate (DEPOTESTOSTERONE CYPIONATE) 200 MG/ML injection   Inject 0.75 ml into the muscle once weekly.  Dispense: 3 mL   Refills: 0     Last prescribed and seen on 06/06/2015. No future appt.

## 2015-07-07 NOTE — Telephone Encounter (Signed)
I received an electronic refill request for his testosterone. Did he get established with Urology? If so, then they will need to manage.

## 2015-07-08 ENCOUNTER — Other Ambulatory Visit: Payer: Self-pay | Admitting: Primary Care

## 2015-07-31 ENCOUNTER — Telehealth: Payer: Self-pay | Admitting: Primary Care

## 2015-07-31 ENCOUNTER — Other Ambulatory Visit: Payer: Self-pay | Admitting: Primary Care

## 2015-07-31 DIAGNOSIS — N529 Male erectile dysfunction, unspecified: Secondary | ICD-10-CM

## 2015-07-31 MED ORDER — SILDENAFIL CITRATE 50 MG PO TABS: 50.0000 mg | ORAL_TABLET | Freq: Every day | ORAL | Status: DC | PRN

## 2015-07-31 NOTE — Telephone Encounter (Signed)
Please notify Gregory Good that i've sent Sildenafil (Viagra) 50 mg tablets to his pharmacy as requested. These may be a little more costly that the 20 mg tablets, so if they are then I can send in a new prescription with more of the 20 mg tablets. Please have him notify me if the new prescription is too expensive.

## 2015-08-01 NOTE — Telephone Encounter (Addendum)
Will send prior auth to patient insurance.

## 2015-08-06 NOTE — Telephone Encounter (Signed)
Waiting on response from insurance.

## 2015-08-11 NOTE — Telephone Encounter (Signed)
[employer] has received a PA determination from your patient's insurance. The PA request for your patient has been archived and marked as 'Approved'.    Request Key: [id number]   Rx #: [id number]   PA created date: 07/31/2015   Outcome: Approved

## 2015-08-21 ENCOUNTER — Other Ambulatory Visit: Payer: Self-pay

## 2015-08-21 DIAGNOSIS — N529 Male erectile dysfunction, unspecified: Secondary | ICD-10-CM

## 2015-08-21 NOTE — Telephone Encounter (Signed)
V/M left requesting refill for # 90 for sildenafil to [facility]. Pt is presently taking 3 tabs at one time.pt request cb. Pt last seen 06/06/15.

## 2015-08-22 MED ORDER — SILDENAFIL CITRATE 20 MG PO TABS: ORAL_TABLET | ORAL | Status: DC

## 2015-08-22 NOTE — Telephone Encounter (Signed)
Patient called back and was notified that sildenafil has been sent to [facility]. Patient verbalized understating.

## 2015-08-22 NOTE — Telephone Encounter (Signed)
Message left for patient to return my call.  

## 2015-11-10 ENCOUNTER — Other Ambulatory Visit: Payer: Self-pay | Admitting: Primary Care

## 2015-11-10 DIAGNOSIS — K219 Gastro-esophageal reflux disease without esophagitis: Secondary | ICD-10-CM

## 2015-11-10 MED ORDER — ESOMEPRAZOLE MAGNESIUM 20 MG PO CPDR: 20.0000 mg | DELAYED_RELEASE_CAPSULE | Freq: Two times a day (BID) | ORAL | 3 refills | Status: DC

## 2015-11-10 NOTE — Telephone Encounter (Signed)
Received faxed refill request for   esomeprazole (NEXIUM) 20 MG capsule. Take 1 capsule by mouth 2 times daily (8 am and 8 pm)  Medication have not been prescribed by [facility]. Last seen on 06/06/2015.

## 2016-01-05 ENCOUNTER — Other Ambulatory Visit: Payer: Self-pay

## 2016-01-05 ENCOUNTER — Telehealth: Payer: Self-pay | Admitting: Primary Care

## 2016-01-05 DIAGNOSIS — E349 Endocrine disorder, unspecified: Secondary | ICD-10-CM

## 2016-01-05 NOTE — Telephone Encounter (Signed)
V/M left requesting refill testosterone to [name]. Pt last seen and rx last refilled # 77ml on 06/06/15.

## 2016-01-05 NOTE — Telephone Encounter (Signed)
Please notify patient that he needs to request refills of his testosterone through his Urologist as I do not manage this.

## 2016-01-06 NOTE — Telephone Encounter (Signed)
Message left for patient to return my call.  

## 2016-01-07 NOTE — Telephone Encounter (Signed)
Tried to call patient and could not leave message since voicemail box is full 

## 2016-04-29 DIAGNOSIS — E291 Testicular hypofunction: Secondary | ICD-10-CM | POA: Diagnosis not present

## 2016-04-29 DIAGNOSIS — Z8551 Personal history of malignant neoplasm of bladder: Secondary | ICD-10-CM | POA: Diagnosis not present

## 2016-05-07 DIAGNOSIS — Z5111 Encounter for antineoplastic chemotherapy: Secondary | ICD-10-CM | POA: Diagnosis not present

## 2016-05-07 DIAGNOSIS — C672 Malignant neoplasm of lateral wall of bladder: Secondary | ICD-10-CM | POA: Diagnosis not present

## 2016-05-14 DIAGNOSIS — Z5111 Encounter for antineoplastic chemotherapy: Secondary | ICD-10-CM | POA: Diagnosis not present

## 2016-05-14 DIAGNOSIS — C672 Malignant neoplasm of lateral wall of bladder: Secondary | ICD-10-CM | POA: Diagnosis not present

## 2016-06-07 DIAGNOSIS — M5430 Sciatica, unspecified side: Secondary | ICD-10-CM | POA: Diagnosis not present

## 2016-06-21 ENCOUNTER — Ambulatory Visit (INDEPENDENT_AMBULATORY_CARE_PROVIDER_SITE_OTHER): Payer: 59 | Admitting: Primary Care

## 2016-06-21 ENCOUNTER — Encounter: Payer: Self-pay | Admitting: Primary Care

## 2016-06-21 VITALS — BP 124/76 | HR 79 | Temp 97.9°F | Ht 70.0 in | Wt 185.4 lb

## 2016-06-21 DIAGNOSIS — J069 Acute upper respiratory infection, unspecified: Secondary | ICD-10-CM | POA: Diagnosis not present

## 2016-06-21 DIAGNOSIS — N529 Male erectile dysfunction, unspecified: Secondary | ICD-10-CM | POA: Diagnosis not present

## 2016-06-21 MED ORDER — FLUTICASONE PROPIONATE 50 MCG/ACT NA SUSP: 1.0000 | Freq: Two times a day (BID) | NASAL | 1 refills | Status: DC | PRN

## 2016-06-21 MED ORDER — SILDENAFIL CITRATE 20 MG PO TABS: ORAL_TABLET | ORAL | 1 refills | Status: DC

## 2016-06-21 NOTE — Patient Instructions (Signed)
Increase your Allegra to 1 full tablet daily for the next 2 weeks.  Nasal Congestion/Sinus Pressure: Try using Flonase (fluticasone) nasal spray. Instill 1 spray in each nostril twice daily.   You may take 600-800 mg of Ibuprofen as needed for inflammation/pressure/headaches.  Please notify me if you develop persistent fevers of 101, start coughing up green mucous, notice increased fatigue or weakness, or feel worse after 1 week of onset of symptoms.   Increase consumption of water intake and rest.  It was a pleasure to see you today!

## 2016-06-21 NOTE — Progress Notes (Signed)
Pre visit review using our clinic review tool, if applicable. No additional management support is needed unless otherwise documented below in the visit note. 

## 2016-06-21 NOTE — Assessment & Plan Note (Signed)
Doing well on sildenafil, refill provided today.

## 2016-06-21 NOTE — Progress Notes (Signed)
Subjective:    Patient ID: Gregory Good, male    DOB: 1955/04/08, 61 y.o.   MRN: [id number]  HPI  Gregory Good is a 61 year old male who presents today with a chief complaint of sinus pressure. He also reports chills, nasal congestion, cough, post nasal drip, sore throat. His symptoms began 4 days ago. His cough is non productive. He's currently taking 1/2 tablet of Allegra, Nyquil, Dayquil, Sudafed, Tylenol without much improvement. He denies fevers, sick contacts.   Review of Systems  Constitutional: Negative for chills, fatigue and fever.  HENT: Positive for congestion, postnasal drip, sinus pressure and sore throat. Negative for ear pain.   Respiratory: Positive for cough. Negative for shortness of breath.   Cardiovascular: Negative for chest pain.  Allergic/Immunologic: Positive for environmental allergies.       Past Medical History:  Diagnosis Date  . Barrett's esophagus   . Bladder cancer [facility]) 2016   Follows with Urology  . Chronic back pain   . GERD (gastroesophageal reflux disease)   . Hyperlipidemia   . IBS (irritable bowel syndrome)      Social History   Social History  . Marital status: Married    Spouse name: N/A  . Number of children: N/A  . Years of education: N/A   Occupational History  . Not on file.   Social History Main Topics  . Smoking status: Light Tobacco Smoker    Packs/day: 0.50    Years: 40.00    Types: Cigarettes  . Smokeless tobacco: Never Used  . Alcohol use No  . Drug use: No  . Sexual activity: Yes   Other Topics Concern  . Not on file   Social History Narrative   Married.    Works as Materials engineer.   Current smoker of 1-2 cigarettes per day.   Enjoys spending time with his family.    Past Surgical History:  Procedure Laterality Date  . CYSTOSCOPY N/A 01/28/2015   Procedure: CYSTOSCOPY;  Surgeon: Irine Seal, MD;  Location: [facility];  Service: Urology;  Laterality: N/A;  . left hip surgery     30 years ago for bone spur     . right knee arthroscopy   2004  . TONSILLECTOMY AND ADENOIDECTOMY    . TRANSURETHRAL RESECTION OF BLADDER TUMOR N/A 01/28/2015   Procedure: TRANSURETHRAL RESECTION OF BLADDER TUMOR (TURBT) MITOMYCIN-C INSTILLATION;  Surgeon: Irine Seal, MD;  Location: [facility];  Service: Urology;  Laterality: N/A;    Family History  Problem Relation Age of Onset  . Kidney cancer Father     No Known Allergies  Current Outpatient Prescriptions on File Prior to Visit  Medication Sig Dispense Refill  . diazepam (VALIUM) 10 MG tablet Take 10 mg by mouth as needed for spasms    . esomeprazole (NEXIUM) 20 MG capsule Take 1 capsule (20 mg total) by mouth 2 (two) times daily before a meal. 180 capsule 3  . fexofenadine (ALLEGRA) 30 MG tablet Take 30 mg by mouth daily as needed (for allergies).     Marland Kitchen HYDROcodone-acetaminophen (NORCO) 10-325 MG tablet Take 1 tablet by mouth every 6 (six) hours as needed.    . testosterone cypionate (DEPOTESTOSTERONE CYPIONATE) 200 MG/ML injection Inject 0.75 ml into the muscle once weekly. 3 mL 0   No current facility-administered medications on file prior to visit.     BP 124/76   Pulse 79   Temp 97.9 F (36.6 C) (Oral)   Ht 5\' 10"  (1.778 m)  Wt 185 lb 6.4 oz (84.1 kg)   SpO2 96%   BMI 26.60 kg/m    Objective:   Physical Exam  Constitutional: He appears well-nourished. He does not appear ill.  HENT:  Right Ear: Tympanic membrane and ear canal normal.  Left Ear: Tympanic membrane and ear canal normal.  Nose: Mucosal edema present. Right sinus exhibits no maxillary sinus tenderness and no frontal sinus tenderness. Left sinus exhibits no maxillary sinus tenderness and no frontal sinus tenderness.  Mouth/Throat: Oropharynx is clear and moist.  Eyes: Conjunctivae are normal.  Neck: Neck supple.  Cardiovascular: Normal rate and regular rhythm.   Pulmonary/Chest: Effort normal and breath sounds normal. He has no wheezes. He has no rales.  Skin: Skin is warm and dry.           Assessment & Plan:  URI vs Viral Sinusitis:  Symptoms x 4 days.  No improvement with OTC treatment. Exam today without evidence of bacterial involvement. Suspect viral involvement and will treat with supportive measures. Rx for Flonase sent to pharmacy. Discussed to increase dose of Allegra, take Ibuprofen PRN. Fluids, rest, follow up PRN.

## 2016-08-12 DIAGNOSIS — E291 Testicular hypofunction: Secondary | ICD-10-CM | POA: Diagnosis not present

## 2016-08-12 DIAGNOSIS — Z8551 Personal history of malignant neoplasm of bladder: Secondary | ICD-10-CM | POA: Diagnosis not present

## 2016-08-18 DIAGNOSIS — R972 Elevated prostate specific antigen [PSA]: Secondary | ICD-10-CM | POA: Diagnosis not present

## 2016-08-18 DIAGNOSIS — E291 Testicular hypofunction: Secondary | ICD-10-CM | POA: Diagnosis not present

## 2016-08-18 DIAGNOSIS — Z8551 Personal history of malignant neoplasm of bladder: Secondary | ICD-10-CM | POA: Diagnosis not present

## 2016-09-12 ENCOUNTER — Encounter (HOSPITAL_COMMUNITY): Payer: Self-pay | Admitting: *Deleted

## 2016-09-12 ENCOUNTER — Ambulatory Visit (HOSPITAL_COMMUNITY)
Admission: EM | Admit: 2016-09-12 | Discharge: 2016-09-12 | Disposition: A | Discharge status: Home / Self Care | Payer: 59 | Attending: Physician Assistant | Admitting: Physician Assistant

## 2016-09-12 DIAGNOSIS — R300 Dysuria: Secondary | ICD-10-CM

## 2016-09-12 DIAGNOSIS — J029 Acute pharyngitis, unspecified: Secondary | ICD-10-CM | POA: Insufficient documentation

## 2016-09-12 DIAGNOSIS — R509 Fever, unspecified: Secondary | ICD-10-CM | POA: Diagnosis not present

## 2016-09-12 LAB — POCT URINALYSIS DIP (DEVICE)
Bilirubin Urine: NEGATIVE
GLUCOSE, UA: NEGATIVE mg/dL
Ketones, ur: NEGATIVE mg/dL
LEUKOCYTES UA: NEGATIVE
NITRITE: NEGATIVE
Protein, ur: NEGATIVE mg/dL
Specific Gravity, Urine: 1.015 (ref 1.005–1.030)
UROBILINOGEN UA: 1 mg/dL (ref 0.0–1.0)
pH: 6.5 (ref 5.0–8.0)

## 2016-09-12 LAB — POCT RAPID STREP A: Streptococcus, Group A Screen (Direct): NEGATIVE

## 2016-09-12 MED ORDER — CEFTRIAXONE SODIUM 1 G IJ SOLR: 1.0000 g | Freq: Once | INTRAMUSCULAR | 0 refills | Status: DC

## 2016-09-12 MED ORDER — CEFTRIAXONE SODIUM 1 G IJ SOLR
INTRAMUSCULAR | Status: AC
  Filled 2016-09-12: qty 10

## 2016-09-12 MED ORDER — IBUPROFEN 800 MG PO TABS
800.0000 mg | ORAL_TABLET | Freq: Once | ORAL | Status: AC
  Administered 2016-09-12: 800 mg via ORAL

## 2016-09-12 MED ORDER — DOXYCYCLINE HYCLATE 100 MG PO CAPS: 100.0000 mg | ORAL_CAPSULE | Freq: Two times a day (BID) | ORAL | 0 refills | Status: AC

## 2016-09-12 MED ORDER — IBUPROFEN 800 MG PO TABS
ORAL_TABLET | ORAL | Status: AC
  Filled 2016-09-12: qty 1

## 2016-09-12 MED ORDER — CEFTRIAXONE SODIUM 1 G IJ SOLR
1.0000 g | Freq: Once | INTRAMUSCULAR | Status: AC
  Administered 2016-09-12: 1 g via INTRAMUSCULAR

## 2016-09-12 MED ORDER — LIDOCAINE HCL (PF) 1 % IJ SOLN
INTRAMUSCULAR | Status: AC
  Filled 2016-09-12: qty 2

## 2016-09-12 MED ORDER — IBUPROFEN 800 MG PO TABS: 800.0000 mg | ORAL_TABLET | Freq: Three times a day (TID) | ORAL | 0 refills | Status: DC

## 2016-09-12 NOTE — ED Notes (Signed)
Pt was at a spa this past week when a filter had broken.

## 2016-09-12 NOTE — ED Provider Notes (Addendum)
CSN: [id number]     Arrival date & time 09/12/16  1907 History   None    Chief Complaint  Patient presents with  . Dysuria  . Sore Throat  . Fever   (Consider location/radiation/quality/duration/timing/severity/associated sxs/prior Treatment) HPI  Sore throat and fever that started two days ago. Did spend some time outside while cutting the grass.  Denies photophobia, neck pain, cough, SOB, DOE, leg swelling.  Has been taking cipro for dysuria that occurs "at the tip of my penis" per his urologist. Is taking no new medications except for cipro. Takes nexium for Barrett's and tells me this is well managed.Was recently cleared for bladder cancer via cystoscopy on the 20th.   Past Medical History:  Diagnosis Date  . Barrett's esophagus   . Bladder cancer [facility]) 2016   Follows with Urology  . Bladder cancer ([place])   . Chronic back pain   . GERD (gastroesophageal reflux disease)   . Hyperlipidemia   . IBS (irritable bowel syndrome)    Past Surgical History:  Procedure Laterality Date  . CYSTOSCOPY N/A 01/28/2015   Procedure: CYSTOSCOPY;  Surgeon: Irine Seal, MD;  Location: [facility];  Service: Urology;  Laterality: N/A;  . left hip surgery     30 years ago for bone spur   . right knee arthroscopy   2004  . TONSILLECTOMY AND ADENOIDECTOMY    . TRANSURETHRAL RESECTION OF BLADDER TUMOR N/A 01/28/2015   Procedure: TRANSURETHRAL RESECTION OF BLADDER TUMOR (TURBT) MITOMYCIN-C INSTILLATION;  Surgeon: Irine Seal, MD;  Location: [facility];  Service: Urology;  Laterality: N/A;   Family History  Problem Relation Age of Onset  . Kidney cancer Father    Social History  Substance Use Topics  . Smoking status: Current Every Day Smoker    Packs/day: 0.50    Years: 40.00    Types: Cigarettes  . Smokeless tobacco: Never Used  . Alcohol use No    Review of Systems  Constitutional: Positive for chills, diaphoresis, fatigue and fever. Negative for activity change and appetite change.  Respiratory:  Negative for chest tightness, shortness of breath and wheezing.   Cardiovascular: Negative for chest pain and leg swelling.  Gastrointestinal: Negative for abdominal distention.  Genitourinary: Positive for dysuria. Negative for decreased urine volume, difficulty urinating, discharge, enuresis, flank pain, frequency, genital sores, hematuria, penile pain, penile swelling and testicular pain.  Musculoskeletal: Negative for arthralgias.  Neurological: Negative for dizziness.  Hematological: Negative for adenopathy.    Allergies  Patient has no known allergies.  Home Medications   Prior to Admission medications   Medication Sig Start Date End Date Taking? Authorizing Provider  ciprofloxacin (CIPRO) 500 MG tablet Take 500 mg by mouth 2 (two) times daily. Pt started taking old Cipro Rx x 2 days.   Yes [provider]  esomeprazole (NEXIUM) 20 MG capsule Take 1 capsule (20 mg total) by mouth 2 (two) times daily before a meal. 11/10/15  Yes Pleas Koch, NP  fexofenadine (ALLEGRA) 30 MG tablet Take 30 mg by mouth daily as needed (for allergies).    Yes [provider]  cefTRIAXone (ROCEPHIN) 1 g injection Inject 1 g into the muscle once. 09/12/16 09/12/16  Tereasa Coop, PA-C  diazepam (VALIUM) 10 MG tablet Take 10 mg by mouth as needed for spasms 12/31/14   [provider]  doxycycline (VIBRAMYCIN) 100 MG capsule Take 1 capsule (100 mg total) by mouth 2 (two) times daily. 09/12/16 09/22/16  Tereasa Coop, PA-C  fluticasone (FLONASE) 50 MCG/ACT nasal spray Place 1 spray into both nostrils 2 (two) times daily as needed for allergies or rhinitis. 06/21/16   Pleas Koch, NP  HYDROcodone-acetaminophen (NORCO) 10-325 MG tablet Take 1 tablet by mouth every 6 (six) hours as needed.    [provider]  sildenafil (REVATIO) 20 MG tablet Take 3 tablets by mouth 1 hour prior to intercourse 06/21/16   Pleas Koch, NP  testosterone cypionate  (DEPOTESTOSTERONE CYPIONATE) 200 MG/ML injection Inject 0.75 ml into the muscle once weekly. 06/06/15   Pleas Koch, NP   Meds Ordered and Administered this Visit   Medications  ibuprofen (ADVIL,MOTRIN) tablet 800 mg (800 mg Oral Given 09/12/16 1953)    BP (!) 140/98   Pulse 89   Temp (!) 101.3 F (38.5 C) (Oral)   Resp 18   SpO2 100%  No data found.   Physical Exam  Constitutional: He is oriented to person, place, and time. He appears well-developed and well-nourished. No distress.  HENT:  Head: Normocephalic.  Right Ear: External ear normal.  Left Ear: External ear normal.  Nose: Nose normal.  Mouth/Throat: Oropharynx is clear and moist. No oropharyngeal exudate.  Eyes: Pupils are equal, round, and reactive to light. Conjunctivae and EOM are normal. Right eye exhibits no discharge. Left eye exhibits no discharge.  Neck: Normal range of motion. Neck supple.  Cardiovascular: Normal rate, regular rhythm, normal heart sounds and intact distal pulses.   Pulmonary/Chest: Effort normal and breath sounds normal. No respiratory distress. He has no wheezes. He has no rales. He exhibits no tenderness.  Abdominal: Soft. Bowel sounds are normal.  Musculoskeletal: Normal range of motion.  Lymphadenopathy:    He has no cervical adenopathy.  Neurological: He is alert and oriented to person, place, and time.  Skin: Skin is warm and dry. Capillary refill takes less than 2 seconds. No rash noted. He is not diaphoretic. No erythema. No pallor.  Psychiatric: He has a normal mood and affect.    Urgent Care Course     Procedures (including critical care time)  Labs Review Labs Reviewed  POCT URINALYSIS DIP (DEVICE) - Abnormal; Notable for the following:       Result Value   Hgb urine dipstick MODERATE (*)    All other components within normal limits  POCT RAPID STREP A    Imaging Review No results found.      MDM   Fever, unspecified: Starting doxy and ceftriaxone here.   Ceftriaxone will cover for strep for now and doxy will cover for tic borne illness.  He will continue cipro per urology recs.  He will keep pushing fluids. This could be a stone however I don't see why he'd have a sore throat and its hard to tell if the hematuria is acute or chronic given his history of bladder cancer and recent cystoscopy. He will try to see his primary tomorrow but if he can't get in I will be happy to see him in my office.   Sore throat: see problem 1.      Tereasa Coop, PA-C 09/12/16 2031    Tereasa Coop, PA-C 09/12/16 2107

## 2016-09-12 NOTE — ED Triage Notes (Signed)
C/O dysuria x 2 days.  Denies polyuria or penile discharge.  C/O severe sore throat with swollen glands, ulcers in mouth, fevers over 100 x 3 days.

## 2016-09-12 NOTE — Discharge Instructions (Signed)
If you need to be seen tomorrow and if you can't be seen by your PCP then come see me at [facility].

## 2016-09-13 ENCOUNTER — Telehealth: Payer: Self-pay

## 2016-09-13 ENCOUNTER — Encounter: Payer: Self-pay | Admitting: Primary Care

## 2016-09-13 ENCOUNTER — Ambulatory Visit (INDEPENDENT_AMBULATORY_CARE_PROVIDER_SITE_OTHER): Payer: 59 | Admitting: Primary Care

## 2016-09-13 ENCOUNTER — Telehealth: Payer: Self-pay | Admitting: Primary Care

## 2016-09-13 VITALS — BP 124/84 | HR 81 | Temp 98.2°F | Ht 70.0 in | Wt 175.8 lb

## 2016-09-13 DIAGNOSIS — R509 Fever, unspecified: Secondary | ICD-10-CM

## 2016-09-13 DIAGNOSIS — R300 Dysuria: Secondary | ICD-10-CM | POA: Diagnosis not present

## 2016-09-13 DIAGNOSIS — K1379 Other lesions of oral mucosa: Secondary | ICD-10-CM | POA: Diagnosis not present

## 2016-09-13 LAB — COMPREHENSIVE METABOLIC PANEL
ALBUMIN: 4.7 g/dL (ref 3.6–5.1)
ALK PHOS: 62 U/L (ref 40–115)
ALT: 13 U/L (ref 9–46)
AST: 24 U/L (ref 10–35)
BILIRUBIN TOTAL: 0.6 mg/dL (ref 0.2–1.2)
BUN: 12 mg/dL (ref 7–25)
CHLORIDE: 102 mmol/L (ref 98–110)
CO2: 23 mmol/L (ref 20–31)
CREATININE: 1.52 mg/dL — AB (ref 0.70–1.33)
Calcium: 9.3 mg/dL (ref 8.6–10.3)
Glucose, Bld: 89 mg/dL (ref 65–99)
Potassium: 4.1 mmol/L (ref 3.5–5.3)
SODIUM: 139 mmol/L (ref 135–146)
TOTAL PROTEIN: 7.7 g/dL (ref 6.1–8.1)

## 2016-09-13 LAB — CBC WITH DIFFERENTIAL/PLATELET
BASOS ABS: 0 {cells}/uL (ref 0–200)
BASOS PCT: 0 %
Eosinophils Absolute: 79 cells/uL (ref 15–500)
Eosinophils Relative: 1 %
HCT: 53 % — ABNORMAL HIGH (ref 38.5–50.0)
HEMOGLOBIN: 18.4 g/dL — AB (ref 13.2–17.1)
LYMPHS ABS: 1422 {cells}/uL (ref 850–3900)
Lymphocytes Relative: 18 %
MCH: 31.8 pg (ref 27.0–33.0)
MCHC: 34.7 g/dL (ref 32.0–36.0)
MCV: 91.7 fL (ref 80.0–100.0)
MPV: 10.2 fL (ref 7.5–12.5)
Monocytes Absolute: 869 cells/uL (ref 200–950)
Monocytes Relative: 11 %
NEUTROS ABS: 5530 {cells}/uL (ref 1500–7800)
Neutrophils Relative %: 70 %
Platelets: 174 10*3/uL (ref 140–400)
RBC: 5.78 MIL/uL (ref 4.20–5.80)
RDW: 14.4 % (ref 11.0–15.0)
WBC: 7.9 10*3/uL (ref 3.8–10.8)

## 2016-09-13 LAB — POC URINALSYSI DIPSTICK (AUTOMATED)
BILIRUBIN UA: NEGATIVE
Blood, UA: NEGATIVE
GLUCOSE UA: NEGATIVE
Ketones, UA: NEGATIVE
Leukocytes, UA: NEGATIVE
NITRITE UA: NEGATIVE
Spec Grav, UA: 1.015 (ref 1.010–1.025)
UROBILINOGEN UA: 0.2 U/dL
pH, UA: 6 (ref 5.0–8.0)

## 2016-09-13 MED ORDER — LIDOCAINE VISCOUS 2 % MT SOLN: OROMUCOSAL | 0 refills | Status: DC

## 2016-09-13 NOTE — Telephone Encounter (Signed)
Treated

## 2016-09-13 NOTE — Telephone Encounter (Signed)
UC notes reviewed. Patient was treated in our office today.

## 2016-09-13 NOTE — Telephone Encounter (Signed)
PLEASE NOTE: All timestamps contained within this report are represented as [place] Standard Time. CONFIDENTIALTY NOTICE: This fax transmission is intended only for the addressee. It contains information that is legally privileged, confidential or otherwise protected from use or disclosure. If you are not the intended recipient, you are strictly prohibited from reviewing, disclosing, copying using or disseminating any of this information or taking any action in reliance on or regarding this information. If you have received this fax in error, please notify us immediately by telephone so that we can arrange for its return to [place]. Phone: [phone], Toll-Free: [phone], Fax: [phone] Page: 1 of 2 Call Id: [id number] [place] Patient Name: Gregory Good Gender: Male DOB: 1955-03-07 Age: 61 Y 43 M Return Phone Number: [phone] (Primary), [phone] (Secondary) City/State/Zip: [place] Client [place] Night - Client Client Site [place] Physician Alma Friendly - NP Who Is Calling Patient / Member / Family / Caregiver Call Type Triage / Clinical Caller Name Magnolia Grosser Relationship To Patient Spouse Return Phone Number [phone] (Secondary) Chief Complaint Fever (non urgent symptom) (> THREE MONTHS) Reason for Call Symptomatic / Request for [place] wants to know if she calls first thing in morning, can husband get appt. He is 60, running a fever 100.1 since Friday. She thinks it may be a bacterial infection from a spa. glands are swollen, flu-like symptoms but she does not think it's the flu. He has been taking Cipro 250 mg since Friday. He continues to have painful urination despite excess water intake. Nurse Assessment Nurse: Junius Creamer, RN, Debra Date/Time (Eastern Time): 09/12/2016  6:11:50 PM Confirm and document reason for call. If symptomatic, describe symptoms. ---Caller states having painful urination. temp 100.1 an hour ago. aching, hard to talk, and swallow. tongue is sore un the underneath, may have a couple of ulcers there. swollen glands. He has been taking Cipro 500mg  bid from old script. Does the PT have any chronic conditions? (i.e. diabetes, asthma, etc.) ---Yes List chronic conditions. ---bladder ca Guidelines Guideline Title Affirmed Question Urination Pain - Male All other males with painful urination Sore Throat SEVERE (e.g., excruciating) throat pain Mouth Ulcers Large blisters in mouth (i.e., fluid filled bubbles or sacs) Disp. Time Eilene Ghazi Time) Disposition Final User 09/12/2016 6:30:11 PM See Physician within 4 Hours (or PCP triage) Yes Junius Creamer, RN, [facility] [place] Advice Given Per Guideline PLEASE NOTE: All timestamps contained within this report are represented as [place] Standard Time. CONFIDENTIALTY NOTICE: This fax transmission is intended only for the addressee. It contains information that is legally privileged, confidential or otherwise protected from use or disclosure. If you are not the intended recipient, you are strictly prohibited from reviewing, disclosing, copying using or disseminating any of this information or taking any action in reliance on or regarding this information. If you have received this fax in error, please notify us immediately by telephone so that we can arrange for its return to [place]. Phone: [phone], Toll-Free: [phone], Fax: [phone] Page: 2 of 2 Call Id: [id number] Care Advice Given Per Guideline SEE PHYSICIAN WITHIN 24 HOURS: * IF OFFICE WILL BE OPEN: You need to be seen within the next 24 hours. Call your doctor when the office opens, and make an appointment. REASSURANCE: Any man with painful urination needs to be checked. It could be a  urinary tract infection. Sometimes the urine is normal and the pain is caused by an infection of the penis or testicle. FLUIDS: Drink extra fluids (Reason: to produce dilute, non-irritating urine). PAIN MEDICINES: * For pain relief, take acetaminophen, ibuprofen, or naproxen. CAUTION - NSAIDS (E.G., IBUPROFEN, NAPROXEN): * You may take this medicine with or without food. Taking it with food or milk may lessen the chance the drug will upset your stomach. CALL BACK IF: * Fever over 100.5 F (38.1 C) occurs * Side (flank) or lower back pain occurs * You become worse. CARE ADVICE given per Urination Pain - Male (Adult) guideline. SEE PHYSICIAN WITHIN 24 HOURS: * IF OFFICE WILL BE CLOSED AND NO PCP TRIAGE: You need to be seen within the next 24 hours. An urgent care center is often a good source of care if your doctor's office is closed. SORE THROAT - For relief of sore throat: * Gargle with warm salt water four times a day. To make salt water, put 1/2 teaspoon of salt in 8 oz (240 ml) of warm water. PAIN OR FEVER MEDICINES: * For pain and fever relief, take acetaminophen or ibuprofen. CAUTION - NSAIDS (E.G., IBUPROFEN, NAPROXEN): * You may take this medicine with or without food. Taking it with food or milk may lessen the chance the drug will upset your stomach. SOFT DIET: * Eat a soft diet. * Cold drinks, popsicles, and milk shakes are especially good. Avoid citrus fruits. DRINK PLENTY LIQUIDS: * Drink plenty of liquids. This is important to prevent dehydration. CALL BACK IF: * You become worse. CARE ADVICE given per Sore Throat (Adult) guideline. SEE PHYSICIAN WITHIN 4 HOURS (or PCP triage): * IF OFFICE WILL BE CLOSED AND NO PCP TRIAGE: You need to be seen within the next 3 or 4 hours. A nearby Urgent Care Center is often a good source of care. Another choice is to go to the ER. Go sooner if you become worse. CALL BACK IF: * You become worse. CARE ADVICE given per Mouth Ulcers (Adult) guideline.

## 2016-09-13 NOTE — Telephone Encounter (Signed)
PLEASE NOTE: All timestamps contained within this report are represented as [place] Standard Time. CONFIDENTIALTY NOTICE: This fax transmission is intended only for the addressee. It contains information that is legally privileged, confidential or otherwise protected from use or disclosure. If you are not the intended recipient, you are strictly prohibited from reviewing, disclosing, copying using or disseminating any of this information or taking any action in reliance on or regarding this information. If you have received this fax in error, please notify us immediately by telephone so that we can arrange for its return to [place]. Phone: [phone], Toll-Free: [phone], Fax: [phone] Page: 1 of 1 Call Id: [id number] [place] Patient Name: Gregory Good Gender: Male DOB: 05-10-1955 Age: 61 Y 56 M 1 D Return Phone Number: [phone] (Primary), [phone] (Secondary) [place] [address] Client [address] Day - Client Client Site [place] - Day Physician Alma Friendly - NP Who Is Calling Patient / Member / Family / Caregiver Call Type Triage / Clinical Relationship To Patient Self Return Phone Number [phone] (Primary) Chief Complaint TONGUE SWELLING - Sudden onset Reason for Call Symptomatic / Request for Health Information Initial Comment Caller has a fever since Friday (last night 101.3), tongue swollen, glands in throat are swollen, and hard to swallow. Went to urgent care last night and tyhey do not know what is going on. Appointment Disposition EMR Patient Reports Appointment Already Scheduled Info pasted into Epic Yes Nurse Assessment Nurse: Ronnald Ramp, RN, Miranda Date/Time (Eastern Time): 09/13/2016 8:22:39 AM Confirm and document reason for call. If symptomatic, describe symptoms. ---Caller states his tongue is  swollen and painful, swollen gland, burning when urinating, and fever. He was seen in UC last night and given IM antibiotics and order oral antibiotics. He feels worse today than yesterday. Temp 100.3. Does the PT have any chronic conditions? (i.e. diabetes, asthma, etc.) ---Yes List chronic conditions. ---Allergies, Barrett's esophagus Guidelines Guideline Title Affirmed Question Infection on Antibiotic Follow-up Call [1] Taking antibiotic < 72 hours (3 days) AND [2] symptoms are SAME (not improved) Disp. Time Eilene Ghazi Time) Disposition Final User 09/13/2016 8:28:02 AM Home Care Yes Ronnald Ramp, RN, [place] Advice Given Per Guideline HOME CARE: You should be able to treat this at home. CONTINUE ANTIBIOTIC: * Continue taking the antibiotic. * Also continue any other treatment instructions from your doctor. CALL BACK IF: * You become worse. CARE ADVICE given per Infection on Antibiotics Follow-Up Call (Adult) guideline.

## 2016-09-13 NOTE — Telephone Encounter (Signed)
Patient Name: Gregory Good DOB: 1955-12-19 Initial Comment Caller has a fever since Friday (last night 101.3), tongue swollen, glands in throat are swollen, and hard to swallow. Went to urgent care last night and tyhey do not know what is going on. Nurse Assessment Nurse: Ronnald Ramp, RN, Miranda Date/Time (Eastern Time): 09/13/2016 8:22:39 AM Confirm and document reason for call. If symptomatic, describe symptoms. ---Caller states his tongue is swollen and painful, swollen gland, burning when urinating, and fever. He was seen in UC last night and given IM antibiotics and order oral antibiotics. He feels worse today than yesterday. Temp 100.3. Does the patient have any new or worsening symptoms? ---Yes Will a triage be completed? ---Yes Related visit to physician within the last 2 weeks? ---Yes Does the PT have any chronic conditions? (i.e. diabetes, asthma, etc.) ---Yes List chronic conditions. ---Allergies, Barrett's esophagus Is this a behavioral health or substance abuse call? ---No Guidelines Guideline Title Affirmed Question Affirmed Notes Infection on Antibiotic Follow-up Call [1] Taking antibiotic < 72 hours (3 days) AND [2] symptoms are SAME (not improved) Final Disposition User [place], RN, Miranda Disagree/Comply: Leta Baptist

## 2016-09-13 NOTE — Progress Notes (Signed)
Subjective:    Patient ID: Gregory Good, male    DOB: 1955/03/25, 62 y.o.   MRN: [id number]  HPI  Gregory Good is a 61 year old male who presents today for Urgent Care Follow up.  He presented to Urgent Care yesterday for a chief complaint of a two day history of sore throat, tongue swelling, and fevers. He was outdoors cutting the grass on Thursday. He was also taking Ciprofloxacin from an old prescription for dysuria that was prescribed by his Urologist, per his urologist's request.  During his stay at Urgent Care he underwent UA with evidence of hematuria; rapid strep which was negative, culture pending. He was treated with oral Doxycycline and IM Rocephin. He was told there was no explanation for his symptoms.  Since his Urgent Care visit he's noticed continued tongue swelling, ulcers to the bilateral sides of his tongue/unde his tongue and cheeks, sore throat. He has been compliant to the Doxycycline and has had two doses. His fevers are running 100-101. He's taken Ibuprofen 800 mg and Tylenol 1000 mg this morning. He denies rashes, abdominal pain, tachycardia, tick bites, cough. He has had some joint aches since the fevers, no joint swelling. He denies any unusual activity, this past weekend he was sanding a deck, cutting the grass, and attended the spa. His wife is unaffected.   Review of Systems  Constitutional: Positive for chills and fever. Negative for fatigue.  HENT: Positive for sore throat. Negative for congestion.        Oral ulcers  Respiratory: Negative for cough, shortness of breath and wheezing.   Cardiovascular: Negative for chest pain.  Genitourinary: Positive for dysuria and hematuria. Negative for discharge, flank pain and testicular pain.  Musculoskeletal: Positive for myalgias.  Skin: Negative for rash.  Neurological: Negative for dizziness and headaches.  Hematological: Negative for adenopathy.       Past Medical History:  Diagnosis Date  . Barrett's  esophagus   . Bladder cancer [facility]) 2016   Follows with Urology  . Bladder cancer ([place])   . Chronic back pain   . GERD (gastroesophageal reflux disease)   . Hyperlipidemia   . IBS (irritable bowel syndrome)      Social History   Social History  . Marital status: Married    Spouse name: N/A  . Number of children: N/A  . Years of education: N/A   Occupational History  . Not on file.   Social History Main Topics  . Smoking status: Current Every Day Smoker    Packs/day: 0.50    Years: 40.00    Types: Cigarettes  . Smokeless tobacco: Never Used  . Alcohol use No  . Drug use: No  . Sexual activity: Yes   Other Topics Concern  . Not on file   Social History Narrative   Married.    Works as Materials engineer.   Current smoker of 1-2 cigarettes per day.   Enjoys spending time with his family.    Past Surgical History:  Procedure Laterality Date  . CYSTOSCOPY N/A 01/28/2015   Procedure: CYSTOSCOPY;  Surgeon: Irine Seal, MD;  Location: [facility];  Service: Urology;  Laterality: N/A;  . left hip surgery     30 years ago for bone spur   . right knee arthroscopy   2004  . TONSILLECTOMY AND ADENOIDECTOMY    . TRANSURETHRAL RESECTION OF BLADDER TUMOR N/A 01/28/2015   Procedure: TRANSURETHRAL RESECTION OF BLADDER TUMOR (TURBT) MITOMYCIN-C INSTILLATION;  Surgeon: Irine Seal, MD;  Location: [facility];  Service: Urology;  Laterality: N/A;    Family History  Problem Relation Age of Onset  . Kidney cancer Father     No Known Allergies  Current Outpatient Prescriptions on File Prior to Visit  Medication Sig Dispense Refill  . diazepam (VALIUM) 10 MG tablet Take 10 mg by mouth as needed for spasms    . doxycycline (VIBRAMYCIN) 100 MG capsule Take 1 capsule (100 mg total) by mouth 2 (two) times daily. 20 capsule 0  . esomeprazole (NEXIUM) 20 MG capsule Take 1 capsule (20 mg total) by mouth 2 (two) times daily before a meal. 180 capsule 3  . fexofenadine (ALLEGRA) 30 MG tablet Take 30 mg by  mouth daily as needed (for allergies).     Marland Kitchen HYDROcodone-acetaminophen (NORCO) 10-325 MG tablet Take 1 tablet by mouth every 6 (six) hours as needed.    Marland Kitchen ibuprofen (ADVIL,MOTRIN) 800 MG tablet Take 1 tablet (800 mg total) by mouth 3 (three) times daily. 21 tablet 0  . sildenafil (REVATIO) 20 MG tablet Take 3 tablets by mouth 1 hour prior to intercourse 50 tablet 1  . testosterone cypionate (DEPOTESTOSTERONE CYPIONATE) 200 MG/ML injection Inject 0.75 ml into the muscle once weekly. 3 mL 0   No current facility-administered medications on file prior to visit.     BP 124/84   Pulse 81   Temp 98.2 F (36.8 C) (Oral)   Ht 5\' 10"  (1.778 m)   Wt 175 lb 12.8 oz (79.7 kg)   SpO2 98%   BMI 25.22 kg/m    Objective:   Physical Exam  Constitutional: He appears well-nourished.  HENT:  Few oral ulcers to buccal cavity bilaterally, under tongue.  Neck: Neck supple.  Cardiovascular: Normal rate and regular rhythm.   Pulmonary/Chest: Effort normal and breath sounds normal. He has no wheezes.  Abdominal: Soft.  Skin: Skin is warm and dry. No erythema.  Psychiatric: He has a normal mood and affect.          Assessment & Plan:  Fever:  Also with tongue swelling, oral ulcers, chills. Rapid strep negative at urgent care, culture pending. UA with moderate blood. Exam today otherwise overall unremarkable. Suspect viral cause given lack of response to antibiotics and presence of oral ulcers. Could be oral herpes simplex versus zoster. Doesn't seem to be hand-foot-and-mouth disease. Check CBC, CMP, HSV, HIV, urine with culture, gonorrhea, Chlamydia. Continue doxycycline for now. He is stable for outpatient treatment, appears very well. Will await lab results.  All urgent care notes, labs reviewed.  Sheral Flow, NP

## 2016-09-13 NOTE — Telephone Encounter (Signed)
Per chart review tab pt seen [facility] on 09/12/16.

## 2016-09-13 NOTE — Telephone Encounter (Signed)
Pt has appt 09/13/16 at 10:45 with Gentry Fitz NP.

## 2016-09-13 NOTE — Patient Instructions (Signed)
Complete lab work prior to leaving today. I will notify you of your results once received.   Continue tylenol and Ibuprofen as discussed. Do not exceed 3000 mg in 24 hours.   Continue Doxycycline as prescribed for now.  It was a pleasure to see you today!

## 2016-09-14 ENCOUNTER — Telehealth: Payer: Self-pay | Admitting: Primary Care

## 2016-09-14 DIAGNOSIS — Z8551 Personal history of malignant neoplasm of bladder: Secondary | ICD-10-CM | POA: Diagnosis not present

## 2016-09-14 DIAGNOSIS — R509 Fever, unspecified: Secondary | ICD-10-CM | POA: Diagnosis not present

## 2016-09-14 DIAGNOSIS — C679 Malignant neoplasm of bladder, unspecified: Secondary | ICD-10-CM | POA: Diagnosis not present

## 2016-09-14 DIAGNOSIS — R300 Dysuria: Secondary | ICD-10-CM | POA: Diagnosis not present

## 2016-09-14 LAB — GC/CHLAMYDIA PROBE AMP
CT PROBE, AMP APTIMA: NOT DETECTED
GC Probe RNA: NOT DETECTED

## 2016-09-14 LAB — HIV ANTIBODY (ROUTINE TESTING W REFLEX): HIV 1&2 Ab, 4th Generation: NONREACTIVE

## 2016-09-14 NOTE — Telephone Encounter (Signed)
Spoken to patient's spouse and notified her that the lab tests usually takes a couple of days to get resulted. We will let patient and spouse know as soon as we can.

## 2016-09-14 NOTE — Telephone Encounter (Signed)
Spouse called checking on cultures that was done yesterday.  She wanted to know how long this will take.  They are going to call urology also to let them know what is going on.  She stated pt penis is still hurting  He feels like he has a fever.

## 2016-09-15 LAB — CULTURE, GROUP A STREP (THRC)

## 2016-09-16 LAB — HSV 1/2 AB (IGM), IFA W/RFLX TITER
HSV 1 IGM SCREEN: NEGATIVE
HSV 2 IGM SCREEN: NEGATIVE

## 2016-11-11 DIAGNOSIS — Z8551 Personal history of malignant neoplasm of bladder: Secondary | ICD-10-CM | POA: Diagnosis not present

## 2016-11-18 DIAGNOSIS — Z8551 Personal history of malignant neoplasm of bladder: Secondary | ICD-10-CM | POA: Diagnosis not present

## 2016-11-26 DIAGNOSIS — C672 Malignant neoplasm of lateral wall of bladder: Secondary | ICD-10-CM | POA: Diagnosis not present

## 2016-11-26 DIAGNOSIS — Z5111 Encounter for antineoplastic chemotherapy: Secondary | ICD-10-CM | POA: Diagnosis not present

## 2016-12-03 DIAGNOSIS — Z5111 Encounter for antineoplastic chemotherapy: Secondary | ICD-10-CM | POA: Diagnosis not present

## 2016-12-03 DIAGNOSIS — C672 Malignant neoplasm of lateral wall of bladder: Secondary | ICD-10-CM | POA: Diagnosis not present

## 2016-12-10 DIAGNOSIS — Z5111 Encounter for antineoplastic chemotherapy: Secondary | ICD-10-CM | POA: Diagnosis not present

## 2016-12-10 DIAGNOSIS — C672 Malignant neoplasm of lateral wall of bladder: Secondary | ICD-10-CM | POA: Diagnosis not present

## 2017-01-03 ENCOUNTER — Other Ambulatory Visit: Payer: Self-pay | Admitting: Primary Care

## 2017-01-03 DIAGNOSIS — K219 Gastro-esophageal reflux disease without esophagitis: Secondary | ICD-10-CM

## 2017-03-03 ENCOUNTER — Other Ambulatory Visit: Payer: Self-pay | Admitting: Primary Care

## 2017-03-03 DIAGNOSIS — M545 Low back pain: Principal | ICD-10-CM

## 2017-03-03 DIAGNOSIS — G8929 Other chronic pain: Secondary | ICD-10-CM

## 2017-03-03 NOTE — Telephone Encounter (Signed)
I do not prescribe these medications for this patient, he follows with neurology in [place]. Please send request to their office. Thanks.

## 2017-03-03 NOTE — Telephone Encounter (Signed)
Copied from [place] [id number]. Topic: Quick Communication - Rx Refill/Question >> Mar 03, 2017  8:46 AM Scherrie Gerlach wrote: Has the patient contacted their pharmacy? no Wife calling for pt to get a refill of pt's  diazepam (VALIUM) 10 MG tablet HYDROcodone-acetaminophen (NORCO) 10-325 MG tablet  Wife states pt's back is hurting him.  [place], [place], [place] [phone] (Phone) [phone] (Fax)   >> Mar 03, 2017  8:56 AM Scherrie Gerlach wrote: Pt has made a cpe for 03/23/2017

## 2017-03-03 NOTE — Telephone Encounter (Signed)
Both medications are on pts Hx list. Last OV 08/2016 acute

## 2017-03-03 NOTE — Telephone Encounter (Deleted)
Copied from [place] [id number]. Topic: Quick Communication - Rx Refill/Question >> Mar 03, 2017  8:46 AM Scherrie Gerlach wrote: Has the patient contacted their pharmacy? no Wife calling for pt to get a refill of pt's  diazepam (VALIUM) 10 MG tablet HYDROcodone-acetaminophen (NORCO) 10-325 MG tablet  Wife states pt's back is hurting him.  [place], [place], [place] [phone] (Phone) [phone] (Fax)

## 2017-03-04 NOTE — Telephone Encounter (Signed)
Spoken to patient's wife. She stated that patient's neurologist Dr Jannifer Franklin have left her office in [facility]. She will be setting up on her own office but never did gave her patients a set date.  They were told from the neurologist office for patient to see if primacy provider will  refill until patient find a new neurologist.  Please advise.

## 2017-03-04 NOTE — Telephone Encounter (Signed)
I will agree to one refill of each so that he can find a replacement neurologist, no refills after that.  Please get the updated prescription and find out how often he's taking both medications.  For example, he got 20 tablets of hydrocodone on 11/25/16 and 90 tablets of diazepam in April 2018.

## 2017-03-07 NOTE — Telephone Encounter (Signed)
Spoken and notified patient's spouse of Kate's comments.    Yes, he got 20 tablets of hydrocodone on 11/25/16 and 90 tablets of diazepam in April 2018.  They are actively looking for one.

## 2017-03-08 MED ORDER — DIAZEPAM 10 MG PO TABS: 10.0000 mg | ORAL_TABLET | Freq: Two times a day (BID) | ORAL | 0 refills | Status: DC | PRN

## 2017-03-08 MED ORDER — HYDROCODONE-ACETAMINOPHEN 10-325 MG PO TABS: 1.0000 | ORAL_TABLET | Freq: Four times a day (QID) | ORAL | 0 refills | Status: DC | PRN

## 2017-03-08 NOTE — Telephone Encounter (Signed)
Please notify patient that his Rx's are ready for pickup. Placed in [place].

## 2017-03-09 NOTE — Telephone Encounter (Signed)
Left message on voicemail that script is up front ready for pickup. (on DPR)

## 2017-03-10 ENCOUNTER — Other Ambulatory Visit: Payer: Self-pay | Admitting: Primary Care

## 2017-03-10 DIAGNOSIS — E785 Hyperlipidemia, unspecified: Secondary | ICD-10-CM

## 2017-03-10 DIAGNOSIS — Z125 Encounter for screening for malignant neoplasm of prostate: Secondary | ICD-10-CM

## 2017-03-16 DIAGNOSIS — M5416 Radiculopathy, lumbar region: Secondary | ICD-10-CM | POA: Diagnosis not present

## 2017-03-18 ENCOUNTER — Other Ambulatory Visit (INDEPENDENT_AMBULATORY_CARE_PROVIDER_SITE_OTHER): Payer: 59

## 2017-03-18 DIAGNOSIS — Z125 Encounter for screening for malignant neoplasm of prostate: Secondary | ICD-10-CM

## 2017-03-18 DIAGNOSIS — E785 Hyperlipidemia, unspecified: Secondary | ICD-10-CM | POA: Diagnosis not present

## 2017-03-18 LAB — COMPREHENSIVE METABOLIC PANEL
ALBUMIN: 4.8 g/dL (ref 3.5–5.2)
ALT: 15 U/L (ref 0–53)
AST: 13 U/L (ref 0–37)
Alkaline Phosphatase: 49 U/L (ref 39–117)
BUN: 12 mg/dL (ref 6–23)
CALCIUM: 9.6 mg/dL (ref 8.4–10.5)
CHLORIDE: 101 meq/L (ref 96–112)
CO2: 29 mEq/L (ref 19–32)
Creatinine, Ser: 1.18 mg/dL (ref 0.40–1.50)
GFR: 66.87 mL/min (ref >60.00)
Glucose, Bld: 131 mg/dL — ABNORMAL HIGH (ref 70–99)
POTASSIUM: 4.6 meq/L (ref 3.5–5.1)
Sodium: 137 mEq/L (ref 135–145)
Total Bilirubin: 0.7 mg/dL (ref 0.2–1.2)
Total Protein: 7.3 g/dL (ref 6.0–8.3)

## 2017-03-18 LAB — LIPID PANEL
CHOL/HDL RATIO: 6
CHOLESTEROL: 216 mg/dL — AB (ref 0–200)
HDL: 35.6 mg/dL — ABNORMAL LOW (ref >39.00)
LDL CALC: 157 mg/dL — AB (ref 0–99)
NonHDL: 180.84
TRIGLYCERIDES: 118 mg/dL (ref 0.0–149.0)
VLDL: 23.6 mg/dL (ref 0.0–40.0)

## 2017-03-18 LAB — PSA: PSA: 2.04 ng/mL (ref 0.10–4.00)

## 2017-03-23 ENCOUNTER — Encounter: Payer: Self-pay | Admitting: Primary Care

## 2017-03-23 ENCOUNTER — Ambulatory Visit (INDEPENDENT_AMBULATORY_CARE_PROVIDER_SITE_OTHER): Payer: 59 | Admitting: Primary Care

## 2017-03-23 VITALS — BP 120/80 | HR 84 | Temp 97.9°F | Ht 69.0 in | Wt 183.8 lb

## 2017-03-23 DIAGNOSIS — C679 Malignant neoplasm of bladder, unspecified: Secondary | ICD-10-CM

## 2017-03-23 DIAGNOSIS — N529 Male erectile dysfunction, unspecified: Secondary | ICD-10-CM | POA: Diagnosis not present

## 2017-03-23 DIAGNOSIS — E785 Hyperlipidemia, unspecified: Secondary | ICD-10-CM | POA: Diagnosis not present

## 2017-03-23 DIAGNOSIS — Z0000 Encounter for general adult medical examination without abnormal findings: Secondary | ICD-10-CM

## 2017-03-23 DIAGNOSIS — K219 Gastro-esophageal reflux disease without esophagitis: Secondary | ICD-10-CM

## 2017-03-23 DIAGNOSIS — E349 Endocrine disorder, unspecified: Secondary | ICD-10-CM

## 2017-03-23 DIAGNOSIS — Z23 Encounter for immunization: Secondary | ICD-10-CM

## 2017-03-23 DIAGNOSIS — M545 Low back pain: Secondary | ICD-10-CM | POA: Diagnosis not present

## 2017-03-23 DIAGNOSIS — G8929 Other chronic pain: Secondary | ICD-10-CM

## 2017-03-23 MED ORDER — TADALAFIL 10 MG PO TABS: ORAL_TABLET | ORAL | 0 refills | Status: DC

## 2017-03-23 MED ORDER — SILDENAFIL CITRATE 20 MG PO TABS: ORAL_TABLET | ORAL | 0 refills | Status: DC

## 2017-03-23 MED ORDER — ZOSTER VAC RECOMB ADJUVANTED 50 MCG/0.5ML IM SUSR: 0.5000 mL | Freq: Once | INTRAMUSCULAR | 1 refills | Status: AC

## 2017-03-23 NOTE — Progress Notes (Signed)
Subjective:    Patient ID: Gregory Good, male    DOB: 10-Jan-1956, 62 y.o.   MRN: [id number]  HPI  Mr. Gregory Good is a 62 year old male who presents today for complete physical. He was not fasting for his labs.  Immunizations: -Tetanus: Completed over 10 years ago.  -Influenza: Declines -Pneumonia: Completed in 2016 -Shingles: Never completed  Diet: He endorses a healthy diet. Breakfast: Cereal Lunch: Left overs, sometimes fast food Dinner: Meat, vegetable, starch Snacks: None Desserts: Ice cream, candy (Daily) Beverages: Coffee, soda, some water, sweet tea, 1-2 times monthly  Exercise: He is not currently exercising.  Colonoscopy: Completed in 2011 PSA: Negative in 2019   Review of Systems  Constitutional: Negative for unexpected weight change.  HENT: Negative for rhinorrhea.   Respiratory: Negative for cough and shortness of breath.   Cardiovascular: Negative for chest pain.  Gastrointestinal: Negative for constipation and diarrhea.  Genitourinary: Negative for difficulty urinating.  Musculoskeletal: Positive for back pain.       Chronic back pain that will flare with increased activity.  Skin: Negative for rash.  Allergic/Immunologic: Negative for environmental allergies.  Neurological: Negative for dizziness, numbness and headaches.  Psychiatric/Behavioral: The patient is not nervous/anxious.        Past Medical History:  Diagnosis Date  . Barrett's esophagus   . Bladder cancer [facility]) 2016   Follows with Urology  . Bladder cancer ([place])   . Chronic back pain   . GERD (gastroesophageal reflux disease)   . Hyperlipidemia   . IBS (irritable bowel syndrome)      Social History   Socioeconomic History  . Marital status: Married    Spouse name: Not on file  . Number of children: Not on file  . Years of education: Not on file  . Highest education level: Not on file  Social Needs  . Financial resource strain: Not on file  . Food insecurity - worry: Not on  file  . Food insecurity - inability: Not on file  . Transportation needs - medical: Not on file  . Transportation needs - non-medical: Not on file  Occupational History  . Not on file  Tobacco Use  . Smoking status: Current Every Day Smoker    Packs/day: 0.50    Years: 40.00    Pack years: 20.00    Types: Cigarettes  . Smokeless tobacco: Never Used  Substance and Sexual Activity  . Alcohol use: Yes    Alcohol/week: 0.0 oz    Comment: very rarely  . Drug use: No  . Sexual activity: Yes  Other Topics Concern  . Not on file  Social History Narrative   Married.    Works as Materials engineer.   Current smoker of 1-2 cigarettes per day.   Enjoys spending time with his family.    Past Surgical History:  Procedure Laterality Date  . CYSTOSCOPY N/A 01/28/2015   Procedure: CYSTOSCOPY;  Surgeon: Irine Seal, MD;  Location: [facility];  Service: Urology;  Laterality: N/A;  . left hip surgery     30 years ago for bone spur   . right knee arthroscopy   2004  . TONSILLECTOMY AND ADENOIDECTOMY    . TRANSURETHRAL RESECTION OF BLADDER TUMOR N/A 01/28/2015   Procedure: TRANSURETHRAL RESECTION OF BLADDER TUMOR (TURBT) MITOMYCIN-C INSTILLATION;  Surgeon: Irine Seal, MD;  Location: [facility];  Service: Urology;  Laterality: N/A;    Family History  Problem Relation Age of Onset  . Kidney cancer Father  No Known Allergies  Current Outpatient Medications on File Prior to Visit  Medication Sig Dispense Refill  . diazepam (VALIUM) 10 MG tablet Take 1 tablet (10 mg total) by mouth every 12 (twelve) hours as needed (muscle spasms). 30 tablet 0  . esomeprazole (NEXIUM) 20 MG capsule TAKE ONE (1) CAPSULE BY MOUTH 2 TIMES DAILY BEFORE A MEAL. 180 capsule 0  . fexofenadine (ALLEGRA) 30 MG tablet Take 30 mg by mouth daily as needed (for allergies).     Marland Kitchen HYDROcodone-acetaminophen (NORCO) 10-325 MG tablet Take 1 tablet by mouth every 6 (six) hours as needed for severe pain. 20 tablet 0  . ibuprofen  (ADVIL,MOTRIN) 800 MG tablet Take 1 tablet (800 mg total) by mouth 3 (three) times daily. 21 tablet 0  . testosterone cypionate (DEPOTESTOSTERONE CYPIONATE) 200 MG/ML injection Inject 0.75 ml into the muscle once weekly. 3 mL 0   No current facility-administered medications on file prior to visit.     BP 120/80   Pulse 84   Temp 97.9 F (36.6 C) (Oral)   Ht 5\' 9"  (1.753 m)   Wt 183 lb 12 oz (83.3 kg)   BMI 27.14 kg/m    Objective:   Physical Exam  Constitutional: He is oriented to person, place, and time. He appears well-nourished.  HENT:  Right Ear: Tympanic membrane and ear canal normal.  Left Ear: Tympanic membrane and ear canal normal.  Nose: Nose normal. Right sinus exhibits no maxillary sinus tenderness and no frontal sinus tenderness. Left sinus exhibits no maxillary sinus tenderness and no frontal sinus tenderness.  Mouth/Throat: Oropharynx is clear and moist.  Eyes: Conjunctivae and EOM are normal. Pupils are equal, round, and reactive to light.  Neck: Neck supple. Carotid bruit is not present. No thyromegaly present.  Cardiovascular: Normal rate, regular rhythm and normal heart sounds.  Pulmonary/Chest: Effort normal and breath sounds normal. He has no wheezes. He has no rales.  Abdominal: Soft. Bowel sounds are normal. There is no tenderness.  Musculoskeletal: Normal range of motion.  Neurological: He is alert and oriented to person, place, and time. He has normal reflexes. No cranial nerve deficit.  Skin: Skin is warm and dry.  Psychiatric: He has a normal mood and affect.          Assessment & Plan:

## 2017-03-23 NOTE — Addendum Note (Signed)
Addended by: Emelia Salisbury C on: 03/23/2017 05:40 PM   Modules accepted: Orders

## 2017-03-23 NOTE — Assessment & Plan Note (Signed)
Following with Urology managed on testosterone injections.

## 2017-03-23 NOTE — Assessment & Plan Note (Addendum)
Not fasting on recent labs, he will schedule a lab appointment to have these repeated when fasting.

## 2017-03-23 NOTE — Assessment & Plan Note (Addendum)
Experiences side effects of flushing feeling and headache from sildenafil use. Overall is effective but he would like to try Cialis to see if this has less of a side effect profile.  He was once managed on Cialis 10 mg and did well.    Prescription for Cialis 10 mg tablets printed, he will check on price and discard prescription of too expensive. Printed prescription for sildenafil to use if Cialis is too costly.  He will call back and update on which prescription he is taking.

## 2017-03-23 NOTE — Assessment & Plan Note (Signed)
Following with Urology. 

## 2017-03-23 NOTE — Assessment & Plan Note (Addendum)
Declines influenza, Shingles Rx printed to take to pharmacy.  Tetanus due, provided today. PSA unremarkable, following with Urology. Colonoscopy up-to-date. Recommended regular exercise and improvement in diet. Exam unremarkable. Labs with hyperlipidemia and hyperglycemia, however, he was not fasting.  We will repeat labs fasting. Follow-up in 1 year for annual exam.

## 2017-03-23 NOTE — Patient Instructions (Addendum)
You were provided with a tetanus vaccination which will cover you for 10 years.  Please notify me whether you use Cialis or sildenafil.   Continue exercising. You should be getting 150 minutes of moderate intensity exercise weekly.  Increase consumption of vegetables, fruit, whole grains.  Ensure you are consuming 64 ounces of water daily.  Schedule a lab only appointment anytime to repeat your cholesterol levels. Make sure you don't eat 8 hours prior to your labs. You may have water and black coffee.  Follow up in 1 year for your annual exam or sooner if needed. It was a pleasure to see you today!   Preventive Care 40-64 Years, Male Preventive care refers to lifestyle choices and visits with your health care provider that can promote health and wellness. What does preventive care include?  A yearly physical exam. This is also called an annual well check.  Dental exams once or twice a year.  Routine eye exams. Ask your health care provider how often you should have your eyes checked.  Personal lifestyle choices, including: ? Daily care of your teeth and gums. ? Regular physical activity. ? Eating a healthy diet. ? Avoiding tobacco and drug use. ? Limiting alcohol use. ? Practicing safe sex. ? Taking low-dose aspirin every day starting at age 24. What happens during an annual well check? The services and screenings done by your health care provider during your annual well check will depend on your age, overall health, lifestyle risk factors, and family history of disease. Counseling Your health care provider may ask you questions about your:  Alcohol use.  Tobacco use.  Drug use.  Emotional well-being.  Home and relationship well-being.  Sexual activity.  Eating habits.  Work and work Statistician.  Screening You may have the following tests or measurements:  Height, weight, and BMI.  Blood pressure.  Lipid and cholesterol levels. These may be checked every  5 years, or more frequently if you are over 103 years old.  Skin check.  Lung cancer screening. You may have this screening every year starting at age 59 if you have a 30-pack-year history of smoking and currently smoke or have quit within the past 15 years.  Fecal occult blood test (FOBT) of the stool. You may have this test every year starting at age 85.  Flexible sigmoidoscopy or colonoscopy. You may have a sigmoidoscopy every 5 years or a colonoscopy every 10 years starting at age 70.  Prostate cancer screening. Recommendations will vary depending on your family history and other risks.  Hepatitis C blood test.  Hepatitis B blood test.  Sexually transmitted disease (STD) testing.  Diabetes screening. This is done by checking your blood sugar (glucose) after you have not eaten for a while (fasting). You may have this done every 1-3 years.  Discuss your test results, treatment options, and if necessary, the need for more tests with your health care provider. Vaccines Your health care provider may recommend certain vaccines, such as:  Influenza vaccine. This is recommended every year.  Tetanus, diphtheria, and acellular pertussis (Tdap, Td) vaccine. You may need a Td booster every 10 years.  Varicella vaccine. You may need this if you have not been vaccinated.  Zoster vaccine. You may need this after age 52.  Measles, mumps, and rubella (MMR) vaccine. You may need at least one dose of MMR if you were born in 1957 or later. You may also need a second dose.  Pneumococcal 13-valent conjugate (PCV13) vaccine. You may  need this if you have certain conditions and have not been vaccinated.  Pneumococcal polysaccharide (PPSV23) vaccine. You may need one or two doses if you smoke cigarettes or if you have certain conditions.  Meningococcal vaccine. You may need this if you have certain conditions.  Hepatitis A vaccine. You may need this if you have certain conditions or if you travel  or work in places where you may be exposed to hepatitis A.  Hepatitis B vaccine. You may need this if you have certain conditions or if you travel or work in places where you may be exposed to hepatitis B.  Haemophilus influenzae type b (Hib) vaccine. You may need this if you have certain risk factors.  Talk to your health care provider about which screenings and vaccines you need and how often you need them. This information is not intended to replace advice given to you by your health care provider. Make sure you discuss any questions you have with your health care provider. Document Released: 03/14/2015 Document Revised: 11/05/2015 Document Reviewed: 12/17/2014 Elsevier Interactive Patient Education  [employer].

## 2017-03-23 NOTE — Assessment & Plan Note (Addendum)
Doing well on Nexium 20 mg once daily, will experience symptoms off of medications.  Continue same.

## 2017-03-23 NOTE — Assessment & Plan Note (Addendum)
No longer following with pain management as they wanted him to start gabapentin and steroids orally. He had side effects on both medications. He will be seeing a neurosurgeon soon regarding chronic back pain.  A prescription of 90 tablets of hydrocodone last him an entire year, seen with diazepam.  Encouraged him to discuss his use of these medicines with his new neurosurgeon.  Discouraged use of hydrocodone and Valium.

## 2017-03-30 DIAGNOSIS — R972 Elevated prostate specific antigen [PSA]: Secondary | ICD-10-CM | POA: Diagnosis not present

## 2017-04-06 DIAGNOSIS — E291 Testicular hypofunction: Secondary | ICD-10-CM | POA: Diagnosis not present

## 2017-04-06 DIAGNOSIS — Z8551 Personal history of malignant neoplasm of bladder: Secondary | ICD-10-CM | POA: Diagnosis not present

## 2017-04-13 ENCOUNTER — Other Ambulatory Visit: Payer: Self-pay

## 2017-04-13 DIAGNOSIS — M545 Low back pain: Secondary | ICD-10-CM

## 2017-04-13 DIAGNOSIS — E349 Endocrine disorder, unspecified: Secondary | ICD-10-CM

## 2017-04-13 DIAGNOSIS — G8929 Other chronic pain: Secondary | ICD-10-CM

## 2017-04-13 NOTE — Telephone Encounter (Signed)
Gregory Good a [place] request refill testosterone 200 mg last refilled # 3 ml on 06/06/15 and valium 10 mg last refilled on 03/08/17; pt last annual on 03/23/17.Please advise.

## 2017-04-13 NOTE — Telephone Encounter (Signed)
Testosterone refill request goes to Urology, please send. Has he ever tried any other muscle relaxer such as Flexeril, Robaxin, or Tizanidine? Did he find a neurosurgeon?

## 2017-04-20 NOTE — Telephone Encounter (Addendum)
Message left for patient to return my call on 04/15/2017  Message left for patient to return my call on 04/20/2017

## 2017-04-26 NOTE — Telephone Encounter (Signed)
Message left for patient to return call on 04/26/17.

## 2017-05-06 ENCOUNTER — Other Ambulatory Visit: Payer: Self-pay | Admitting: Primary Care

## 2017-05-06 DIAGNOSIS — K219 Gastro-esophageal reflux disease without esophagitis: Secondary | ICD-10-CM

## 2017-05-19 DIAGNOSIS — M5126 Other intervertebral disc displacement, lumbar region: Secondary | ICD-10-CM | POA: Diagnosis not present

## 2017-05-26 DIAGNOSIS — M5126 Other intervertebral disc displacement, lumbar region: Secondary | ICD-10-CM | POA: Diagnosis not present

## 2017-05-26 DIAGNOSIS — M545 Low back pain: Secondary | ICD-10-CM | POA: Diagnosis not present

## 2017-06-07 DIAGNOSIS — M5416 Radiculopathy, lumbar region: Secondary | ICD-10-CM | POA: Diagnosis not present

## 2017-06-07 DIAGNOSIS — R030 Elevated blood-pressure reading, without diagnosis of hypertension: Secondary | ICD-10-CM | POA: Diagnosis not present

## 2017-06-07 DIAGNOSIS — Z6827 Body mass index (BMI) 27.0-27.9, adult: Secondary | ICD-10-CM | POA: Diagnosis not present

## 2017-06-29 HISTORY — PX: OTHER SURGICAL HISTORY: SHX169

## 2017-07-04 DIAGNOSIS — M5416 Radiculopathy, lumbar region: Secondary | ICD-10-CM | POA: Diagnosis not present

## 2017-07-13 DIAGNOSIS — M5126 Other intervertebral disc displacement, lumbar region: Secondary | ICD-10-CM | POA: Diagnosis not present

## 2017-07-13 DIAGNOSIS — M5416 Radiculopathy, lumbar region: Secondary | ICD-10-CM | POA: Diagnosis not present

## 2017-07-14 DIAGNOSIS — M5126 Other intervertebral disc displacement, lumbar region: Secondary | ICD-10-CM | POA: Diagnosis not present

## 2017-07-14 DIAGNOSIS — Z6827 Body mass index (BMI) 27.0-27.9, adult: Secondary | ICD-10-CM | POA: Diagnosis not present

## 2017-07-27 DIAGNOSIS — M5126 Other intervertebral disc displacement, lumbar region: Secondary | ICD-10-CM | POA: Diagnosis not present

## 2017-07-27 DIAGNOSIS — M4726 Other spondylosis with radiculopathy, lumbar region: Secondary | ICD-10-CM | POA: Diagnosis not present

## 2017-07-27 DIAGNOSIS — M47816 Spondylosis without myelopathy or radiculopathy, lumbar region: Secondary | ICD-10-CM | POA: Diagnosis not present

## 2017-07-27 DIAGNOSIS — M5116 Intervertebral disc disorders with radiculopathy, lumbar region: Secondary | ICD-10-CM | POA: Diagnosis not present

## 2017-08-17 ENCOUNTER — Other Ambulatory Visit: Payer: Self-pay | Admitting: Primary Care

## 2017-08-17 DIAGNOSIS — M545 Low back pain: Secondary | ICD-10-CM | POA: Diagnosis not present

## 2017-08-17 DIAGNOSIS — N529 Male erectile dysfunction, unspecified: Secondary | ICD-10-CM

## 2017-08-17 MED ORDER — SILDENAFIL CITRATE 20 MG PO TABS: ORAL_TABLET | ORAL | 0 refills | Status: DC

## 2017-08-18 DIAGNOSIS — E230 Hypopituitarism: Secondary | ICD-10-CM | POA: Diagnosis not present

## 2017-08-18 DIAGNOSIS — Z8551 Personal history of malignant neoplasm of bladder: Secondary | ICD-10-CM | POA: Diagnosis not present

## 2017-09-28 ENCOUNTER — Other Ambulatory Visit: Payer: Self-pay | Admitting: Primary Care

## 2017-09-28 DIAGNOSIS — N529 Male erectile dysfunction, unspecified: Secondary | ICD-10-CM

## 2017-10-12 ENCOUNTER — Other Ambulatory Visit: Payer: Self-pay | Admitting: Primary Care

## 2017-10-12 DIAGNOSIS — N529 Male erectile dysfunction, unspecified: Secondary | ICD-10-CM

## 2017-10-28 DIAGNOSIS — C672 Malignant neoplasm of lateral wall of bladder: Secondary | ICD-10-CM | POA: Diagnosis not present

## 2017-11-11 DIAGNOSIS — C672 Malignant neoplasm of lateral wall of bladder: Secondary | ICD-10-CM | POA: Diagnosis not present

## 2017-11-11 DIAGNOSIS — Z5111 Encounter for antineoplastic chemotherapy: Secondary | ICD-10-CM | POA: Diagnosis not present

## 2017-11-18 DIAGNOSIS — C672 Malignant neoplasm of lateral wall of bladder: Secondary | ICD-10-CM | POA: Diagnosis not present

## 2017-11-18 DIAGNOSIS — Z5111 Encounter for antineoplastic chemotherapy: Secondary | ICD-10-CM | POA: Diagnosis not present

## 2017-12-21 ENCOUNTER — Other Ambulatory Visit: Payer: Self-pay | Admitting: Primary Care

## 2017-12-21 DIAGNOSIS — N529 Male erectile dysfunction, unspecified: Secondary | ICD-10-CM

## 2018-01-05 DIAGNOSIS — Z8551 Personal history of malignant neoplasm of bladder: Secondary | ICD-10-CM | POA: Diagnosis not present

## 2018-02-13 ENCOUNTER — Other Ambulatory Visit: Payer: Self-pay | Admitting: Primary Care

## 2018-02-13 DIAGNOSIS — N529 Male erectile dysfunction, unspecified: Secondary | ICD-10-CM

## 2018-03-15 ENCOUNTER — Other Ambulatory Visit: Payer: Self-pay | Admitting: Primary Care

## 2018-03-15 DIAGNOSIS — N529 Male erectile dysfunction, unspecified: Secondary | ICD-10-CM

## 2018-03-17 NOTE — Telephone Encounter (Signed)
Needs office visit for further refills. Please schedule.

## 2018-03-20 NOTE — Telephone Encounter (Signed)
Lvm asking pt to call office 

## 2018-03-27 NOTE — Telephone Encounter (Signed)
Lvm asking pt to call office 

## 2018-03-30 DIAGNOSIS — E291 Testicular hypofunction: Secondary | ICD-10-CM | POA: Diagnosis not present

## 2018-03-30 DIAGNOSIS — R972 Elevated prostate specific antigen [PSA]: Secondary | ICD-10-CM | POA: Diagnosis not present

## 2018-03-30 DIAGNOSIS — Z8551 Personal history of malignant neoplasm of bladder: Secondary | ICD-10-CM | POA: Diagnosis not present

## 2018-04-06 DIAGNOSIS — Z8551 Personal history of malignant neoplasm of bladder: Secondary | ICD-10-CM | POA: Diagnosis not present

## 2018-04-12 ENCOUNTER — Other Ambulatory Visit: Payer: Self-pay | Admitting: Primary Care

## 2018-04-12 DIAGNOSIS — E785 Hyperlipidemia, unspecified: Secondary | ICD-10-CM

## 2018-04-12 DIAGNOSIS — Z1159 Encounter for screening for other viral diseases: Secondary | ICD-10-CM

## 2018-04-17 ENCOUNTER — Other Ambulatory Visit (INDEPENDENT_AMBULATORY_CARE_PROVIDER_SITE_OTHER): Payer: 59

## 2018-04-17 DIAGNOSIS — E785 Hyperlipidemia, unspecified: Secondary | ICD-10-CM

## 2018-04-17 DIAGNOSIS — Z1159 Encounter for screening for other viral diseases: Secondary | ICD-10-CM | POA: Diagnosis not present

## 2018-04-17 LAB — LIPID PANEL
Cholesterol: 185 mg/dL (ref 0–200)
HDL: 29.2 mg/dL — ABNORMAL LOW (ref >39.00)
LDL CALC: 123 mg/dL — AB (ref 0–99)
NonHDL: 155.98
Total CHOL/HDL Ratio: 6
Triglycerides: 163 mg/dL — ABNORMAL HIGH (ref 0.0–149.0)
VLDL: 32.6 mg/dL (ref 0.0–40.0)

## 2018-04-17 LAB — COMPREHENSIVE METABOLIC PANEL
ALT: 23 U/L (ref 0–53)
AST: 19 U/L (ref 0–37)
Albumin: 4.6 g/dL (ref 3.5–5.2)
Alkaline Phosphatase: 53 U/L (ref 39–117)
BUN: 18 mg/dL (ref 6–23)
CO2: 29 meq/L (ref 19–32)
Calcium: 9.7 mg/dL (ref 8.4–10.5)
Chloride: 102 mEq/L (ref 96–112)
Creatinine, Ser: 1.59 mg/dL — ABNORMAL HIGH (ref 0.40–1.50)
GFR: 44.43 mL/min — ABNORMAL LOW (ref >60.00)
Glucose, Bld: 99 mg/dL (ref 70–99)
Potassium: 4.8 mEq/L (ref 3.5–5.1)
Sodium: 138 mEq/L (ref 135–145)
Total Bilirubin: 0.8 mg/dL (ref 0.2–1.2)
Total Protein: 7.3 g/dL (ref 6.0–8.3)

## 2018-04-17 LAB — HEMOGLOBIN A1C: Hgb A1c MFr Bld: 5.4 % (ref 4.6–6.5)

## 2018-04-18 LAB — HEPATITIS C ANTIBODY
HEP C AB: NONREACTIVE
SIGNAL TO CUT-OFF: 0.04 (ref <1.00)

## 2018-04-21 ENCOUNTER — Ambulatory Visit (INDEPENDENT_AMBULATORY_CARE_PROVIDER_SITE_OTHER): Payer: 59 | Admitting: Primary Care

## 2018-04-21 ENCOUNTER — Encounter: Payer: Self-pay | Admitting: Primary Care

## 2018-04-21 VITALS — BP 124/86 | HR 78 | Temp 98.2°F | Ht 70.0 in | Wt 190.8 lb

## 2018-04-21 DIAGNOSIS — E785 Hyperlipidemia, unspecified: Secondary | ICD-10-CM

## 2018-04-21 DIAGNOSIS — C679 Malignant neoplasm of bladder, unspecified: Secondary | ICD-10-CM

## 2018-04-21 DIAGNOSIS — M545 Low back pain, unspecified: Secondary | ICD-10-CM

## 2018-04-21 DIAGNOSIS — N529 Male erectile dysfunction, unspecified: Secondary | ICD-10-CM | POA: Diagnosis not present

## 2018-04-21 DIAGNOSIS — K219 Gastro-esophageal reflux disease without esophagitis: Secondary | ICD-10-CM

## 2018-04-21 DIAGNOSIS — E349 Endocrine disorder, unspecified: Secondary | ICD-10-CM

## 2018-04-21 DIAGNOSIS — Z0000 Encounter for general adult medical examination without abnormal findings: Secondary | ICD-10-CM | POA: Diagnosis not present

## 2018-04-21 DIAGNOSIS — G8929 Other chronic pain: Secondary | ICD-10-CM

## 2018-04-21 DIAGNOSIS — N289 Disorder of kidney and ureter, unspecified: Secondary | ICD-10-CM

## 2018-04-21 NOTE — Assessment & Plan Note (Signed)
Doing well on IM testosterone, reduced to 0.5 ml weekly.  Following with Urology

## 2018-04-21 NOTE — Assessment & Plan Note (Signed)
Doing well on sildenafil, continue same. 

## 2018-04-21 NOTE — Assessment & Plan Note (Signed)
Underwent diskectomy x 2, doing much better. No longer on Norco or diazepam.

## 2018-04-21 NOTE — Assessment & Plan Note (Signed)
Tetanus UTD, influenza vaccination provided. PSA UTD. Colonoscopy UTD, will send off for records. Recommended regular exercise, healthy diet. Exam unremarkable. Labs reviewed. Follow up in 1 year for CPE.

## 2018-04-21 NOTE — Progress Notes (Signed)
Subjective:    Patient ID: Gregory Good, male    DOB: 1955/06/24, 63 y.o.   MRN: [id number]  HPI  Gregory Good is a 62 year old male who presents today for complete physical.  Immunizations: -Tetanus: Completed in 2019 -Influenza: Due -Pneumonia: Completed in 2016 -Shingles: Never completed   Diet: He endorses a healthy diet Breakfast: Cereal Lunch: Left overs  Dinner: Meat, vegetable, starch Snacks: Ice cream  Desserts: 5 days weekly  Beverages: Sweet tea, soda, some water, 2 times weekly   Exercise: He is not exercising, active at home Eye exam: Completed years ago Dental exam: Completes annually  Colonoscopy: Completed in 2011 per care everywhere, thinks he had one at [facility] PSA: 2.04 in 2019, follows with Urology Hep C Screen: Negative   Review of Systems  Constitutional: Negative for unexpected weight change.  HENT: Negative for rhinorrhea.   Respiratory: Negative for cough and shortness of breath.   Cardiovascular: Negative for chest pain.  Gastrointestinal: Negative for constipation and diarrhea.  Genitourinary: Negative for difficulty urinating.  Musculoskeletal: Negative for arthralgias and back pain.  Skin: Negative for rash.  Allergic/Immunologic: Negative for environmental allergies.  Neurological: Negative for dizziness and headaches.  Psychiatric/Behavioral: The patient is not nervous/anxious.        Past Medical History:  Diagnosis Date  . Barrett's esophagus   . Bladder cancer [facility]) 2016   Follows with Urology  . Bladder cancer ([place])   . Chronic back pain   . GERD (gastroesophageal reflux disease)   . Hyperlipidemia   . IBS (irritable bowel syndrome)      Social History   Socioeconomic History  . Marital status: Married    Spouse name: Not on file  . Number of children: Not on file  . Years of education: Not on file  . Highest education level: Not on file  Occupational History  . Not on file  Social Needs  . Financial  resource strain: Not on file  . Food insecurity:    Worry: Not on file    Inability: Not on file  . Transportation needs:    Medical: Not on file    Non-medical: Not on file  Tobacco Use  . Smoking status: Current Every Day Smoker    Packs/day: 0.50    Years: 40.00    Pack years: 20.00    Types: Cigarettes  . Smokeless tobacco: Never Used  Substance and Sexual Activity  . Alcohol use: Yes    Alcohol/week: 0.0 standard drinks    Comment: very rarely  . Drug use: No  . Sexual activity: Yes  Lifestyle  . Physical activity:    Days per week: Not on file    Minutes per session: Not on file  . Stress: Not on file  Relationships  . Social connections:    Talks on phone: Not on file    Gets together: Not on file    Attends religious service: Not on file    Active member of club or organization: Not on file    Attends meetings of clubs or organizations: Not on file    Relationship status: Not on file  . Intimate partner violence:    Fear of current or ex partner: Not on file    Emotionally abused: Not on file    Physically abused: Not on file    Forced sexual activity: Not on file  Other Topics Concern  . Not on file  Social History Narrative   Married.  Works as Materials engineer.   Current smoker of 1-2 cigarettes per day.   Enjoys spending time with his family.    Past Surgical History:  Procedure Laterality Date  . CYSTOSCOPY N/A 01/28/2015   Procedure: CYSTOSCOPY;  Surgeon: Irine Seal, MD;  Location: [facility];  Service: Urology;  Laterality: N/A;  . left hip surgery     30 years ago for bone spur   . right knee arthroscopy   2004  . TONSILLECTOMY AND ADENOIDECTOMY    . TRANSURETHRAL RESECTION OF BLADDER TUMOR N/A 01/28/2015   Procedure: TRANSURETHRAL RESECTION OF BLADDER TUMOR (TURBT) MITOMYCIN-C INSTILLATION;  Surgeon: Irine Seal, MD;  Location: [facility];  Service: Urology;  Laterality: N/A;    Family History  Problem Relation Age of Onset  . Kidney cancer Father      No Known Allergies  Current Outpatient Medications on File Prior to Visit  Medication Sig Dispense Refill  . esomeprazole (NEXIUM) 20 MG capsule TAKE 1 CAPSULE BY MOUTH 2 TIMES DAILY BEFORE A MEAL 180 capsule 1  . fexofenadine (ALLEGRA) 30 MG tablet Take 30 mg by mouth daily as needed (for allergies).     . sildenafil (REVATIO) 20 MG tablet TAKE 2 TO 3 TABLETS (40-60 MG TOTAL) BY MOUTH 1 HOUR PRIOR TO INTERCOURSE 90 tablet 0  . testosterone cypionate (DEPOTESTOSTERONE CYPIONATE) 200 MG/ML injection Inject 0.75 ml into the muscle once weekly. 3 mL 0   No current facility-administered medications on file prior to visit.     BP 124/86   Pulse 78   Temp 98.2 F (36.8 C) (Oral)   Ht 5\' 10"  (1.778 m)   Wt 190 lb 12 oz (86.5 kg)   SpO2 97%   BMI 27.37 kg/m    Objective:   Physical Exam  Constitutional: He is oriented to person, place, and time. He appears well-nourished.  HENT:  Mouth/Throat: No oropharyngeal exudate.  Eyes: Pupils are equal, round, and reactive to light. EOM are normal.  Neck: Neck supple. No thyromegaly present.  Cardiovascular: Normal rate and regular rhythm.  Respiratory: Effort normal and breath sounds normal.  GI: Soft. Bowel sounds are normal. There is no abdominal tenderness.  Musculoskeletal: Normal range of motion.  Neurological: He is alert and oriented to person, place, and time.  Skin: Skin is warm and dry.  Psychiatric: He has a normal mood and affect.           Assessment & Plan:

## 2018-04-21 NOTE — Assessment & Plan Note (Signed)
Noted on prior labs, more of a decrease on recent labs. Discussed proper hydration with water, avoid NSAID's.  Repeat renal function in 2 weeks.

## 2018-04-21 NOTE — Assessment & Plan Note (Signed)
Improved when compared to last check. Discussed to work on exercise, healthy diet. Continue to monitor annually.

## 2018-04-21 NOTE — Assessment & Plan Note (Signed)
Following with Urology, no longer on treatment. Next follow up in 3 months, next scope in 6 months.

## 2018-04-21 NOTE — Patient Instructions (Addendum)
Start exercising. You should be getting 150 minutes of moderate intensity exercise weekly.  It's important to improve your diet by reducing consumption of fast food, fried food, processed snack foods, sugary drinks. Increase consumption of fresh vegetables and fruits, whole grains, water.  Ensure you are drinking 64 ounces of water daily.  Avoid medications such as Ibuprofen, Aleve, naproxen, Motrin, Advil. These can be toxic to the kidneys.  Schedule a lab only appointment to return for repeat kidney function testing.   We will see you next year or sooner if needed.  It was a pleasure to see you today!   Preventive Care 40-64 Years, Male Preventive care refers to lifestyle choices and visits with your health care provider that can promote health and wellness. What does preventive care include?   A yearly physical exam. This is also called an annual well check.  Dental exams once or twice a year.  Routine eye exams. Ask your health care provider how often you should have your eyes checked.  Personal lifestyle choices, including: ? Daily care of your teeth and gums. ? Regular physical activity. ? Eating a healthy diet. ? Avoiding tobacco and drug use. ? Limiting alcohol use. ? Practicing safe sex. ? Taking low-dose aspirin every day starting at age 76. What happens during an annual well check? The services and screenings done by your health care provider during your annual well check will depend on your age, overall health, lifestyle risk factors, and family history of disease. Counseling Your health care provider may ask you questions about your:  Alcohol use.  Tobacco use.  Drug use.  Emotional well-being.  Home and relationship well-being.  Sexual activity.  Eating habits.  Work and work Statistician. Screening You may have the following tests or measurements:  Height, weight, and BMI.  Blood pressure.  Lipid and cholesterol levels. These may be checked  every 5 years, or more frequently if you are over 69 years old.  Skin check.  Lung cancer screening. You may have this screening every year starting at age 61 if you have a 30-pack-year history of smoking and currently smoke or have quit within the past 15 years.  Colorectal cancer screening. All adults should have this screening starting at age 36 and continuing until age 2. Your health care provider may recommend screening at age 75. You will have tests every 1-10 years, depending on your results and the type of screening test. People at increased risk should start screening at an earlier age. Screening tests may include: ? Guaiac-based fecal occult blood testing. ? Fecal immunochemical test (FIT). ? Stool DNA test. ? Virtual colonoscopy. ? Sigmoidoscopy. During this test, a flexible tube with a tiny camera (sigmoidoscope) is used to examine your rectum and lower colon. The sigmoidoscope is inserted through your anus into your rectum and lower colon. ? Colonoscopy. During this test, a long, thin, flexible tube with a tiny camera (colonoscope) is used to examine your entire colon and rectum.  Prostate cancer screening. Recommendations will vary depending on your family history and other risks.  Hepatitis C blood test.  Hepatitis B blood test.  Sexually transmitted disease (STD) testing.  Diabetes screening. This is done by checking your blood sugar (glucose) after you have not eaten for a while (fasting). You may have this done every 1-3 years. Discuss your test results, treatment options, and if necessary, the need for more tests with your health care provider. Vaccines Your health care provider may recommend certain vaccines, such  as:  Influenza vaccine. This is recommended every year.  Tetanus, diphtheria, and acellular pertussis (Tdap, Td) vaccine. You may need a Td booster every 10 years.  Varicella vaccine. You may need this if you have not been vaccinated.  Zoster vaccine.  You may need this after age 31.  Measles, mumps, and rubella (MMR) vaccine. You may need at least one dose of MMR if you were born in 1957 or later. You may also need a second dose.  Pneumococcal 13-valent conjugate (PCV13) vaccine. You may need this if you have certain conditions and have not been vaccinated.  Pneumococcal polysaccharide (PPSV23) vaccine. You may need one or two doses if you smoke cigarettes or if you have certain conditions.  Meningococcal vaccine. You may need this if you have certain conditions.  Hepatitis A vaccine. You may need this if you have certain conditions or if you travel or work in places where you may be exposed to hepatitis A.  Hepatitis B vaccine. You may need this if you have certain conditions or if you travel or work in places where you may be exposed to hepatitis B.  Haemophilus influenzae type b (Hib) vaccine. You may need this if you have certain risk factors. Talk to your health care provider about which screenings and vaccines you need and how often you need them. This information is not intended to replace advice given to you by your health care provider. Make sure you discuss any questions you have with your health care provider. Document Released: 03/14/2015 Document Revised: 04/07/2017 Document Reviewed: 12/17/2014 Elsevier Interactive Patient Education  2019 [employer].

## 2018-04-21 NOTE — Assessment & Plan Note (Signed)
Doing well on nexium, continue same.

## 2018-04-26 ENCOUNTER — Other Ambulatory Visit: Payer: Self-pay | Admitting: Primary Care

## 2018-04-26 DIAGNOSIS — N529 Male erectile dysfunction, unspecified: Secondary | ICD-10-CM

## 2018-04-26 MED ORDER — TADALAFIL 20 MG PO TABS: ORAL_TABLET | ORAL | 3 refills | Status: DC

## 2018-05-03 ENCOUNTER — Other Ambulatory Visit: Payer: Self-pay | Admitting: Primary Care

## 2018-05-03 DIAGNOSIS — N289 Disorder of kidney and ureter, unspecified: Secondary | ICD-10-CM

## 2018-05-10 ENCOUNTER — Other Ambulatory Visit (INDEPENDENT_AMBULATORY_CARE_PROVIDER_SITE_OTHER): Payer: 59

## 2018-05-10 ENCOUNTER — Other Ambulatory Visit: Payer: Self-pay

## 2018-05-10 ENCOUNTER — Other Ambulatory Visit: Payer: Self-pay | Admitting: Primary Care

## 2018-05-10 DIAGNOSIS — N289 Disorder of kidney and ureter, unspecified: Secondary | ICD-10-CM

## 2018-05-10 LAB — BASIC METABOLIC PANEL
BUN: 15 mg/dL (ref 6–23)
CO2: 28 mEq/L (ref 19–32)
Calcium: 9.5 mg/dL (ref 8.4–10.5)
Chloride: 102 mEq/L (ref 96–112)
Creatinine, Ser: 1.43 mg/dL (ref 0.40–1.50)
GFR: 50.21 mL/min — AB (ref >60.00)
Glucose, Bld: 95 mg/dL (ref 70–99)
Potassium: 4.1 mEq/L (ref 3.5–5.1)
Sodium: 137 mEq/L (ref 135–145)

## 2018-07-06 ENCOUNTER — Other Ambulatory Visit: Payer: Self-pay | Admitting: Primary Care

## 2018-07-06 DIAGNOSIS — K219 Gastro-esophageal reflux disease without esophagitis: Secondary | ICD-10-CM

## 2018-07-07 DIAGNOSIS — E291 Testicular hypofunction: Secondary | ICD-10-CM | POA: Diagnosis not present

## 2018-07-07 DIAGNOSIS — R972 Elevated prostate specific antigen [PSA]: Secondary | ICD-10-CM | POA: Diagnosis not present

## 2018-08-16 ENCOUNTER — Other Ambulatory Visit: Payer: Self-pay | Admitting: Primary Care

## 2018-08-16 DIAGNOSIS — N529 Male erectile dysfunction, unspecified: Secondary | ICD-10-CM

## 2018-08-17 NOTE — Telephone Encounter (Signed)
Ok to refill? Last prescribed on 03/17/2018. Last appointment on 04/21/2018. No future appointment

## 2018-08-17 NOTE — Telephone Encounter (Signed)
I believe he is back on Cialis.  Will you please call to clarify? Does he want the generic Viagra or Cialis?

## 2018-08-18 NOTE — Telephone Encounter (Signed)
Message left for patient to return my call.  

## 2018-08-21 ENCOUNTER — Other Ambulatory Visit: Payer: Self-pay | Admitting: Primary Care

## 2018-08-21 DIAGNOSIS — N529 Male erectile dysfunction, unspecified: Secondary | ICD-10-CM

## 2018-08-21 NOTE — Telephone Encounter (Signed)
Patient is currently taking Cialis

## 2018-08-28 ENCOUNTER — Telehealth: Payer: Self-pay | Admitting: Primary Care

## 2018-08-28 NOTE — Telephone Encounter (Signed)
Please call pt to set up 1st Shingrix vaccine. Tues 7/14, 7/21, 7/28, 8/4 and 8/11 are preferred. Slots are [id number],1200, 230, 245, 300, 315 and 330.

## 2018-09-12 ENCOUNTER — Ambulatory Visit: Payer: 59

## 2018-11-08 ENCOUNTER — Other Ambulatory Visit: Payer: Self-pay | Admitting: Primary Care

## 2018-11-08 DIAGNOSIS — N529 Male erectile dysfunction, unspecified: Secondary | ICD-10-CM

## 2018-11-11 ENCOUNTER — Other Ambulatory Visit: Payer: Self-pay | Admitting: Primary Care

## 2018-11-11 DIAGNOSIS — N529 Male erectile dysfunction, unspecified: Secondary | ICD-10-CM

## 2018-11-15 ENCOUNTER — Other Ambulatory Visit: Payer: Self-pay | Admitting: Primary Care

## 2018-11-17 ENCOUNTER — Other Ambulatory Visit: Payer: Self-pay | Admitting: Primary Care

## 2018-11-17 ENCOUNTER — Telehealth: Payer: Self-pay | Admitting: Primary Care

## 2018-11-17 DIAGNOSIS — N529 Male erectile dysfunction, unspecified: Secondary | ICD-10-CM

## 2018-11-17 MED ORDER — SILDENAFIL CITRATE 20 MG PO TABS: ORAL_TABLET | ORAL | 0 refills | Status: DC

## 2018-11-17 NOTE — Telephone Encounter (Signed)
Patient stated that he was taking both to see which one he liked better. He stated that he liked the Sildenafil better and would like that one called into the pharmacy

## 2018-11-17 NOTE — Telephone Encounter (Signed)
Noted, Rx for sildenafil sent to pharmacy.

## 2018-11-17 NOTE — Telephone Encounter (Signed)
Left detail for patient. I have decline refill request for sildenafil because back in June, patient stated that he is taking tadalafil. Need to confirm with patient which Rx is he taking.

## 2018-11-17 NOTE — Telephone Encounter (Signed)
Patient wanted to let Anda Kraft know the tests done by Dr. Jeffie Pollock were all negative.  Patient would like medications refilled.  Patient uses [facility]. Patient said [facility] sent over refill requests and they were denied.

## 2018-11-17 NOTE — Telephone Encounter (Signed)
Ok to refill sildenafil 20 mg.

## 2018-11-24 ENCOUNTER — Telehealth: Payer: Self-pay

## 2018-11-24 NOTE — Telephone Encounter (Signed)
Patient would like to have antibody testing.  I did explain to him that antibody testing is unclear right now and there is not a lot of conclusive information on result interpretation because the coronavirus is such a broad virus.    He verbalizes understanding and was told this by another 58 office whom he inquired about testing but is wondering what his PCP thinks.  Patient would like to know if PCP is aware of any clear, definitive best antibody testing that could be performed and, if so, can she order?  In addition, he has not had a direct exposure but someone in his building has been dx with COVID.  He will be going to an outside of [facility] testing site on Tuesday for testing.  He is not symptomatic and not directly exposed but is just curious and wanting a test.

## 2018-11-24 NOTE — Telephone Encounter (Signed)
It seems like he is wanting both a COVID test and a COVID antibody test? If he is getting to be tested and I recommend he wait for antibody testing until his COVID test results return. If his results are negative then we can complete the antibody test which has supposedly improved. Have him update [place].

## 2018-11-27 NOTE — Telephone Encounter (Signed)
Per DPR, left detail message of Kate Clark's comments for patient to call back 

## 2018-11-29 NOTE — Telephone Encounter (Signed)
Per DPR, left detail message of Kate Clark's comments for patient to call back 

## 2018-12-09 ENCOUNTER — Other Ambulatory Visit: Payer: Self-pay | Admitting: Primary Care

## 2018-12-09 DIAGNOSIS — K219 Gastro-esophageal reflux disease without esophagitis: Secondary | ICD-10-CM

## 2018-12-09 DIAGNOSIS — N529 Male erectile dysfunction, unspecified: Secondary | ICD-10-CM

## 2018-12-12 NOTE — Telephone Encounter (Signed)
Rx for sildenafil sent last month. Does he really need a refill?  If so then how many tablets and how often is he taking?

## 2018-12-13 NOTE — Telephone Encounter (Signed)
Message left for patient to return my call.  

## 2018-12-19 NOTE — Telephone Encounter (Signed)
Message left for patient to return my call.  

## 2019-01-05 ENCOUNTER — Ambulatory Visit: Payer: 59 | Admitting: Primary Care

## 2019-01-05 ENCOUNTER — Other Ambulatory Visit: Payer: Self-pay

## 2019-01-05 ENCOUNTER — Telehealth: Payer: Self-pay | Admitting: Radiology

## 2019-01-05 VITALS — BP 124/84 | HR 78 | Temp 97.8°F | Ht 70.0 in | Wt 184.5 lb

## 2019-01-05 DIAGNOSIS — M545 Low back pain, unspecified: Secondary | ICD-10-CM

## 2019-01-05 DIAGNOSIS — Z23 Encounter for immunization: Secondary | ICD-10-CM

## 2019-01-05 DIAGNOSIS — G8929 Other chronic pain: Secondary | ICD-10-CM | POA: Diagnosis not present

## 2019-01-05 DIAGNOSIS — Z113 Encounter for screening for infections with a predominantly sexual mode of transmission: Secondary | ICD-10-CM | POA: Diagnosis not present

## 2019-01-05 DIAGNOSIS — N289 Disorder of kidney and ureter, unspecified: Secondary | ICD-10-CM | POA: Diagnosis not present

## 2019-01-05 LAB — CBC
HCT: 53.2 % — ABNORMAL HIGH (ref 39.0–52.0)
Hemoglobin: 18.3 g/dL (ref 13.0–17.0)
MCHC: 34.4 g/dL (ref 30.0–36.0)
MCV: 93.7 fl (ref 78.0–100.0)
Platelets: 230 10*3/uL (ref 150.0–400.0)
RBC: 5.67 Mil/uL (ref 4.22–5.81)
RDW: 13.7 % (ref 11.5–15.5)
WBC: 7.2 10*3/uL (ref 4.0–10.5)

## 2019-01-05 LAB — POC URINALSYSI DIPSTICK (AUTOMATED)
Bilirubin, UA: NEGATIVE
Glucose, UA: NEGATIVE
Ketones, UA: NEGATIVE
Leukocytes, UA: NEGATIVE
Nitrite, UA: NEGATIVE
Protein, UA: NEGATIVE
Spec Grav, UA: 1.015 (ref 1.010–1.025)
Urobilinogen, UA: 0.2 E.U./dL
pH, UA: 6 (ref 5.0–8.0)

## 2019-01-05 LAB — BASIC METABOLIC PANEL
BUN: 12 mg/dL (ref 6–23)
CO2: 32 mEq/L (ref 19–32)
Calcium: 9.6 mg/dL (ref 8.4–10.5)
Chloride: 100 mEq/L (ref 96–112)
Creatinine, Ser: 1.26 mg/dL (ref 0.40–1.50)
GFR: 57.98 mL/min — ABNORMAL LOW (ref >60.00)
Glucose, Bld: 93 mg/dL (ref 70–99)
Potassium: 4.4 mEq/L (ref 3.5–5.1)
Sodium: 138 mEq/L (ref 135–145)

## 2019-01-05 NOTE — Progress Notes (Signed)
Subjective:    Patient ID: Gregory Good, male    DOB: 08/16/1955, 63 y.o.   MRN: [id number]  HPI  Mr. Gregory Good is a 63 year old male with a history of bladder cancer, testosterone deficiency, erectile dysfunction, chronic back pain, decreased renal function who presents today with a chief complaint of back/flank pain.  His pain is located to the upper bilateral lumbar back for which he noticed four days ago. Prior to then he was working around his home all weekend building a fire pit and doing other labor intensive activity.  He describes his pain as sharp/twinge that occurs mostly when he moves from sitting to standing, standing to sitting.   He denies numbness/tingling, hematuria, radiation of pain, dysuria, urinary frequency. He is also needing his hematocrit checked per Urology, also would like STD testing.   Review of Systems  Genitourinary: Negative for dysuria, frequency and hematuria.  Musculoskeletal: Positive for back pain.  Neurological: Negative for weakness and numbness.       Past Medical History:  Diagnosis Date  . Barrett's esophagus   . Bladder cancer [facility]) 2016   Follows with Urology  . Bladder cancer ([place])   . Chronic back pain   . GERD (gastroesophageal reflux disease)   . Hyperlipidemia   . IBS (irritable bowel syndrome)      Social History   Socioeconomic History  . Marital status: Married    Spouse name: Not on file  . Number of children: Not on file  . Years of education: Not on file  . Highest education level: Not on file  Occupational History  . Not on file  Social Needs  . Financial resource strain: Not on file  . Food insecurity    Worry: Not on file    Inability: Not on file  . Transportation needs    Medical: Not on file    Non-medical: Not on file  Tobacco Use  . Smoking status: Current Every Day Smoker    Packs/day: 0.50    Years: 40.00    Pack years: 20.00    Types: Cigarettes  . Smokeless tobacco: Never Used  Substance and  Sexual Activity  . Alcohol use: Yes    Alcohol/week: 0.0 standard drinks    Comment: very rarely  . Drug use: No  . Sexual activity: Yes  Lifestyle  . Physical activity    Days per week: Not on file    Minutes per session: Not on file  . Stress: Not on file  Relationships  . Social Herbalist on phone: Not on file    Gets together: Not on file    Attends religious service: Not on file    Active member of club or organization: Not on file    Attends meetings of clubs or organizations: Not on file    Relationship status: Not on file  . Intimate partner violence    Fear of current or ex partner: Not on file    Emotionally abused: Not on file    Physically abused: Not on file    Forced sexual activity: Not on file  Other Topics Concern  . Not on file  Social History Narrative   Married.    Works as Materials engineer.   Current smoker of 1-2 cigarettes per day.   Enjoys spending time with his family.    Past Surgical History:  Procedure Laterality Date  . CYSTOSCOPY N/A 01/28/2015   Procedure: CYSTOSCOPY;  Surgeon: Irine Seal,  MD;  Location: [facility];  Service: Urology;  Laterality: N/A;  . left hip surgery     30 years ago for bone spur   . right knee arthroscopy   2004  . TONSILLECTOMY AND ADENOIDECTOMY    . TRANSURETHRAL RESECTION OF BLADDER TUMOR N/A 01/28/2015   Procedure: TRANSURETHRAL RESECTION OF BLADDER TUMOR (TURBT) MITOMYCIN-C INSTILLATION;  Surgeon: Irine Seal, MD;  Location: [facility];  Service: Urology;  Laterality: N/A;    Family History  Problem Relation Age of Onset  . Kidney cancer Father     No Known Allergies  Current Outpatient Medications on File Prior to Visit  Medication Sig Dispense Refill  . esomeprazole (NEXIUM) 20 MG capsule TAKE 1 CAPSULE BY MOUTH TWICE DAILY BEFORE A MEAL 180 capsule 1  . fexofenadine (ALLEGRA) 30 MG tablet Take 30 mg by mouth daily as needed (for allergies).     . sildenafil (REVATIO) 20 MG tablet Take 2-5 tablets by  mouth 30 minutes prior to intercourse. 90 tablet 0  . testosterone cypionate (DEPOTESTOSTERONE CYPIONATE) 200 MG/ML injection Inject 0.75 ml into the muscle once weekly. 3 mL 0   No current facility-administered medications on file prior to visit.     BP 124/84   Pulse 78   Temp 97.8 F (36.6 C) (Temporal)   Ht 5\' 10"  (1.778 m)   Wt 184 lb 8 oz (83.7 kg)   SpO2 97%   BMI 26.47 kg/m    Objective:   Physical Exam  Constitutional: He appears well-nourished.  Neck: Neck supple.  Cardiovascular: Normal rate and regular rhythm.  Respiratory: Effort normal and breath sounds normal.  GI: There is no CVA tenderness.  Skin: Skin is warm and dry.  Psychiatric: He has a normal mood and affect.           Assessment & Plan:

## 2019-01-05 NOTE — Telephone Encounter (Signed)
Left detailed message on voicemail. DPR 

## 2019-01-05 NOTE — Telephone Encounter (Signed)
[facility] called a critical Hgb-18.3, results given to Dr.Duncan

## 2019-01-05 NOTE — Patient Instructions (Addendum)
Stop by the lab prior to leaving today. I will notify you of your results once received.   Work on stretching, applying heat/ice to reduce symptoms of back pain.  Schedule a nurse visit for 2-6 months for your second shingles vaccine.  It was a pleasure to see you today!

## 2019-01-05 NOTE — Addendum Note (Signed)
Addended by: Jacqualin Combes on: 01/05/2019 12:22 PM   Modules accepted: Orders

## 2019-01-05 NOTE — Assessment & Plan Note (Addendum)
Repeat BMP and CBC pending.

## 2019-01-05 NOTE — Telephone Encounter (Signed)
Notify pt.  Awaiting his other labs but his HGB is elevated- this is similar to prev.  I want to d/w PCP and we'll go from there.  Thanks.

## 2019-01-05 NOTE — Assessment & Plan Note (Signed)
Labs pending today.  

## 2019-01-05 NOTE — Assessment & Plan Note (Signed)
Acute on chronic back pain, likely secondary to MSK irritation given activity prior to pain and negative UA.  Discussed conservative treatment. UA today negative, trace blood which is always present.   He will update.

## 2019-01-06 LAB — C. TRACHOMATIS/N. GONORRHOEAE RNA
C. trachomatis RNA, TMA: NOT DETECTED
N. gonorrhoeae RNA, TMA: NOT DETECTED

## 2019-01-08 LAB — HEPATITIS C ANTIBODY
Hepatitis C Ab: NONREACTIVE
SIGNAL TO CUT-OFF: 0.02 (ref <1.00)

## 2019-01-08 LAB — HIV ANTIBODY (ROUTINE TESTING W REFLEX): HIV 1&2 Ab, 4th Generation: NONREACTIVE

## 2019-01-08 LAB — RPR: RPR Ser Ql: NONREACTIVE

## 2019-02-01 ENCOUNTER — Other Ambulatory Visit: Payer: Self-pay | Admitting: Primary Care

## 2019-02-01 DIAGNOSIS — N529 Male erectile dysfunction, unspecified: Secondary | ICD-10-CM

## 2019-03-22 ENCOUNTER — Other Ambulatory Visit: Payer: Self-pay | Admitting: Primary Care

## 2019-03-22 DIAGNOSIS — N529 Male erectile dysfunction, unspecified: Secondary | ICD-10-CM

## 2019-03-23 NOTE — Telephone Encounter (Signed)
Does he actually need a refill or was this an autofill?  How many tablets is he using at a time? How often does he take this medication?

## 2019-03-23 NOTE — Telephone Encounter (Signed)
Last prescribed on 02/01/2019. Last appointment on 01/05/2019. No future appointment

## 2019-04-02 ENCOUNTER — Other Ambulatory Visit: Payer: Self-pay | Admitting: Primary Care

## 2019-04-02 DIAGNOSIS — N529 Male erectile dysfunction, unspecified: Secondary | ICD-10-CM

## 2019-05-02 ENCOUNTER — Other Ambulatory Visit: Payer: Self-pay | Admitting: Primary Care

## 2019-05-02 DIAGNOSIS — N289 Disorder of kidney and ureter, unspecified: Secondary | ICD-10-CM

## 2019-05-02 DIAGNOSIS — E785 Hyperlipidemia, unspecified: Secondary | ICD-10-CM

## 2019-05-02 DIAGNOSIS — E349 Endocrine disorder, unspecified: Secondary | ICD-10-CM

## 2019-05-03 ENCOUNTER — Telehealth: Payer: Self-pay

## 2019-05-03 ENCOUNTER — Other Ambulatory Visit: Payer: Self-pay

## 2019-05-03 ENCOUNTER — Other Ambulatory Visit (INDEPENDENT_AMBULATORY_CARE_PROVIDER_SITE_OTHER): Payer: 59

## 2019-05-03 DIAGNOSIS — E349 Endocrine disorder, unspecified: Secondary | ICD-10-CM

## 2019-05-03 DIAGNOSIS — N289 Disorder of kidney and ureter, unspecified: Secondary | ICD-10-CM

## 2019-05-03 DIAGNOSIS — E785 Hyperlipidemia, unspecified: Secondary | ICD-10-CM

## 2019-05-03 LAB — LIPID PANEL
Cholesterol: 194 mg/dL (ref 0–200)
HDL: 26.8 mg/dL — ABNORMAL LOW (ref >39.00)
NonHDL: 167.29
Total CHOL/HDL Ratio: 7
Triglycerides: 217 mg/dL — ABNORMAL HIGH (ref 0.0–149.0)
VLDL: 43.4 mg/dL — ABNORMAL HIGH (ref 0.0–40.0)

## 2019-05-03 LAB — COMPREHENSIVE METABOLIC PANEL
ALT: 20 U/L (ref 0–53)
AST: 17 U/L (ref 0–37)
Albumin: 4.4 g/dL (ref 3.5–5.2)
Alkaline Phosphatase: 52 U/L (ref 39–117)
BUN: 15 mg/dL (ref 6–23)
CO2: 30 mEq/L (ref 19–32)
Calcium: 9.7 mg/dL (ref 8.4–10.5)
Chloride: 100 mEq/L (ref 96–112)
Creatinine, Ser: 1.39 mg/dL (ref 0.40–1.50)
GFR: 51.71 mL/min — ABNORMAL LOW (ref >60.00)
Glucose, Bld: 93 mg/dL (ref 70–99)
Potassium: 4.4 mEq/L (ref 3.5–5.1)
Sodium: 135 mEq/L (ref 135–145)
Total Bilirubin: 0.9 mg/dL (ref 0.2–1.2)
Total Protein: 7.2 g/dL (ref 6.0–8.3)

## 2019-05-03 LAB — CBC
HCT: 53.4 % — ABNORMAL HIGH (ref 39.0–52.0)
Hemoglobin: 18.8 g/dL (ref 13.0–17.0)
MCHC: 35.1 g/dL (ref 30.0–36.0)
MCV: 92.8 fl (ref 78.0–100.0)
Platelets: 215 10*3/uL (ref 150.0–400.0)
RBC: 5.76 Mil/uL (ref 4.22–5.81)
RDW: 13.5 % (ref 11.5–15.5)
WBC: 7.5 10*3/uL (ref 4.0–10.5)

## 2019-05-03 LAB — LDL CHOLESTEROL, DIRECT: Direct LDL: 132 mg/dL

## 2019-05-03 LAB — PSA: PSA: 3.19 ng/mL (ref 0.10–4.00)

## 2019-05-03 NOTE — Telephone Encounter (Signed)
[facility] called a critical result @ 1150  Hemoglobin 18.8

## 2019-05-03 NOTE — Telephone Encounter (Signed)
Noted. This is a chronic issue for him, likely secondary to testosterone treatment. I recommended he share this with his Urologist. We will discuss next week.

## 2019-05-11 ENCOUNTER — Encounter: Payer: Self-pay | Admitting: Primary Care

## 2019-05-11 ENCOUNTER — Ambulatory Visit (INDEPENDENT_AMBULATORY_CARE_PROVIDER_SITE_OTHER): Payer: 59 | Admitting: Primary Care

## 2019-05-11 ENCOUNTER — Other Ambulatory Visit: Payer: Self-pay

## 2019-05-11 VITALS — BP 124/82 | HR 84 | Temp 96.1°F | Ht 70.0 in | Wt 187.0 lb

## 2019-05-11 DIAGNOSIS — N529 Male erectile dysfunction, unspecified: Secondary | ICD-10-CM | POA: Diagnosis not present

## 2019-05-11 DIAGNOSIS — Z0000 Encounter for general adult medical examination without abnormal findings: Secondary | ICD-10-CM

## 2019-05-11 DIAGNOSIS — Z23 Encounter for immunization: Secondary | ICD-10-CM | POA: Diagnosis not present

## 2019-05-11 DIAGNOSIS — K219 Gastro-esophageal reflux disease without esophagitis: Secondary | ICD-10-CM

## 2019-05-11 DIAGNOSIS — E785 Hyperlipidemia, unspecified: Secondary | ICD-10-CM | POA: Diagnosis not present

## 2019-05-11 DIAGNOSIS — N289 Disorder of kidney and ureter, unspecified: Secondary | ICD-10-CM | POA: Diagnosis not present

## 2019-05-11 DIAGNOSIS — Z122 Encounter for screening for malignant neoplasm of respiratory organs: Secondary | ICD-10-CM

## 2019-05-11 DIAGNOSIS — E349 Endocrine disorder, unspecified: Secondary | ICD-10-CM

## 2019-05-11 DIAGNOSIS — C679 Malignant neoplasm of bladder, unspecified: Secondary | ICD-10-CM

## 2019-05-11 MED ORDER — TESTOSTERONE CYPIONATE 200 MG/ML IM SOLN: INTRAMUSCULAR | 0 refills | Status: AC

## 2019-05-11 MED ORDER — SILDENAFIL CITRATE 20 MG PO TABS: ORAL_TABLET | ORAL | 2 refills | Status: DC

## 2019-05-11 MED ORDER — EZETIMIBE 10 MG PO TABS: 10.0000 mg | ORAL_TABLET | Freq: Every day | ORAL | 3 refills | Status: DC

## 2019-05-11 NOTE — Progress Notes (Signed)
Subjective:    Patient ID: Gregory Good, male    DOB: 03-Oct-1955, 65 y.o.   MRN: [id number]  HPI  This visit occurred during the SARS-CoV-2 public health emergency.  Safety protocols were in place, including screening questions prior to the visit, additional usage of staff PPE, and extensive cleaning of exam room while observing appropriate contact time as indicated for disinfecting solutions.   Gregory Good is a 64 year old male who presents today for complete physical.  Immunizations: -Tetanus: Completed in 2019 -Influenza: Completed this season  -Shingles: Completed first dose in 2020 -Pneumonia: Completed in 2016  Diet: He endorses a fair diet. He is not drinking water. Drinks only sweet tea, coffee, soda.  Exercise: He is active, not exercising.   Eye exam: Due, he plans to schedule Dental exam: Completes semi-annually   Colonoscopy: Completed around 2012-2013, in [place]  PSA: 3.19, follows with Urology Hep C Screen: Negative  Lung Cancer Screening: Never completed.  Has smoked 1/2 to 1 pack of cigarettes daily since he was 60.  BP Readings from Last 3 Encounters:  05/11/19 124/82  01/05/19 124/84  04/21/18 124/86   The 10-year ASCVD risk score Mikey Bussing DC Jr., et al., 2013) is: 20.7%   Values used to calculate the score:     Age: 43 years     Sex: Male     Is Non-Hispanic African American: No     Diabetic: No     Tobacco smoker: Yes     Systolic Blood Pressure: [id number] mmHg     Is BP treated: No     HDL Cholesterol: 26.8 mg/dL     Total Cholesterol: 194 mg/dL     Review of Systems  Constitutional: Negative for unexpected weight change.  HENT: Negative for rhinorrhea.   Respiratory: Negative for cough and shortness of breath.   Cardiovascular: Negative for chest pain.  Gastrointestinal: Negative for constipation and diarrhea.  Genitourinary: Negative for difficulty urinating.  Musculoskeletal: Negative for arthralgias.  Skin: Negative for rash.    Allergic/Immunologic: Negative for environmental allergies.  Neurological: Negative for dizziness and headaches.  Psychiatric/Behavioral: The patient is not nervous/anxious.        Past Medical History:  Diagnosis Date  . Barrett's esophagus   . Bladder cancer [facility]) 2016   Follows with Urology  . Bladder cancer ([place])   . Chronic back pain   . GERD (gastroesophageal reflux disease)   . Hyperlipidemia   . IBS (irritable bowel syndrome)      Social History   Socioeconomic History  . Marital status: Married    Spouse name: Not on file  . Number of children: Not on file  . Years of education: Not on file  . Highest education level: Not on file  Occupational History  . Not on file  Tobacco Use  . Smoking status: Current Every Day Smoker    Packs/day: 0.50    Years: 40.00    Pack years: 20.00    Types: Cigarettes  . Smokeless tobacco: Never Used  Substance and Sexual Activity  . Alcohol use: Yes    Alcohol/week: 0.0 standard drinks    Comment: very rarely  . Drug use: No  . Sexual activity: Yes  Other Topics Concern  . Not on file  Social History Narrative   Married.    Works as Materials engineer.   Current smoker of 1-2 cigarettes per day.   Enjoys spending time with his family.   Social Determinants of Health  Financial Resource Strain:   . Difficulty of Paying Living Expenses:   Food Insecurity:   . Worried About Charity fundraiser in the Last Year:   . Arboriculturist in the Last Year:   Transportation Needs:   . Film/video editor (Medical):   Marland Kitchen Lack of Transportation (Non-Medical):   Physical Activity:   . Days of Exercise per Week:   . Minutes of Exercise per Session:   Stress:   . Feeling of Stress :   Social Connections:   . Frequency of Communication with Friends and Family:   . Frequency of Social Gatherings with Friends and Family:   . Attends Religious Services:   . Active Member of Clubs or Organizations:   . Attends Archivist  Meetings:   Marland Kitchen Marital Status:   Intimate Partner Violence:   . Fear of Current or Ex-Partner:   . Emotionally Abused:   Marland Kitchen Physically Abused:   . Sexually Abused:     Past Surgical History:  Procedure Laterality Date  . CYSTOSCOPY N/A 01/28/2015   Procedure: CYSTOSCOPY;  Surgeon: Irine Seal, MD;  Location: [facility];  Service: Urology;  Laterality: N/A;  . left hip surgery     30 years ago for bone spur   . right knee arthroscopy   2004  . TONSILLECTOMY AND ADENOIDECTOMY    . TRANSURETHRAL RESECTION OF BLADDER TUMOR N/A 01/28/2015   Procedure: TRANSURETHRAL RESECTION OF BLADDER TUMOR (TURBT) MITOMYCIN-C INSTILLATION;  Surgeon: Irine Seal, MD;  Location: [facility];  Service: Urology;  Laterality: N/A;    Family History  Problem Relation Age of Onset  . Kidney cancer Father     No Known Allergies  Current Outpatient Medications on File Prior to Visit  Medication Sig Dispense Refill  . esomeprazole (NEXIUM) 20 MG capsule TAKE 1 CAPSULE BY MOUTH TWICE DAILY BEFORE A MEAL 180 capsule 1  . fexofenadine (ALLEGRA) 30 MG tablet Take 30 mg by mouth daily as needed (for allergies).      No current facility-administered medications on file prior to visit.    BP 124/82   Pulse 84   Temp (!) 96.1 F (35.6 C) (Temporal)   Ht 5\' 10"  (1.778 m)   Wt 187 lb (84.8 kg)   SpO2 98%   BMI 26.83 kg/m    Objective:   Physical Exam  Constitutional: He is oriented to person, place, and time. He appears well-nourished.  HENT:  Right Ear: Tympanic membrane and ear canal normal.  Left Ear: Tympanic membrane and ear canal normal.  Mouth/Throat: Oropharynx is clear and moist.  Eyes: Pupils are equal, round, and reactive to light. EOM are normal.  Cardiovascular: Normal rate and regular rhythm.  Respiratory: Effort normal and breath sounds normal.  GI: Soft. Bowel sounds are normal. There is no abdominal tenderness.  Musculoskeletal:        General: Normal range of motion.     Cervical back: Neck  supple.  Neurological: He is alert and oriented to person, place, and time. No cranial nerve deficit.  Reflex Scores:      Patellar reflexes are 2+ on the right side and 2+ on the left side. Skin: Skin is warm and dry.  Psychiatric: He has a normal mood and affect.           Assessment & Plan:

## 2019-05-11 NOTE — Patient Instructions (Signed)
You will be contacted regarding your referral to lung cancer screening and the ultrasound.  Please let us know if you have not been contacted within two weeks.   Start Zetia medication for cholesterol.   Start exercising. You should be getting 150 minutes of moderate intensity exercise weekly.  It's important to improve your diet by reducing consumption of fast food, fried food, processed snack foods, sugary drinks. Increase consumption of fresh vegetables and fruits, whole grains, water.  Ensure you are drinking 64 ounces of water daily.  It was a pleasure to see you today!   Preventive Care 60-61 Years Old, Male Preventive care refers to lifestyle choices and visits with your health care provider that can promote health and wellness. This includes:  A yearly physical exam. This is also called an annual well check.  Regular dental and eye exams.  Immunizations.  Screening for certain conditions.  Healthy lifestyle choices, such as eating a healthy diet, getting regular exercise, not using drugs or products that contain nicotine and tobacco, and limiting alcohol use. What can I expect for my preventive care visit? Physical exam Your health care provider will check:  Height and weight. These may be used to calculate body mass index (BMI), which is a measurement that tells if you are at a healthy weight.  Heart rate and blood pressure.  Your skin for abnormal spots. Counseling Your health care provider may ask you questions about:  Alcohol, tobacco, and drug use.  Emotional well-being.  Home and relationship well-being.  Sexual activity.  Eating habits.  Work and work Statistician. What immunizations do I need?  Influenza (flu) vaccine  This is recommended every year. Tetanus, diphtheria, and pertussis (Tdap) vaccine  You may need a Td booster every 10 years. Varicella (chickenpox) vaccine  You may need this vaccine if you have not already been  vaccinated. Zoster (shingles) vaccine  You may need this after age 53. Measles, mumps, and rubella (MMR) vaccine  You may need at least one dose of MMR if you were born in 1957 or later. You may also need a second dose. Pneumococcal conjugate (PCV13) vaccine  You may need this if you have certain conditions and were not previously vaccinated. Pneumococcal polysaccharide (PPSV23) vaccine  You may need one or two doses if you smoke cigarettes or if you have certain conditions. Meningococcal conjugate (MenACWY) vaccine  You may need this if you have certain conditions. Hepatitis A vaccine  You may need this if you have certain conditions or if you travel or work in places where you may be exposed to hepatitis A. Hepatitis B vaccine  You may need this if you have certain conditions or if you travel or work in places where you may be exposed to hepatitis B. Haemophilus influenzae type b (Hib) vaccine  You may need this if you have certain risk factors. Human papillomavirus (HPV) vaccine  If recommended by your health care provider, you may need three doses over 6 months. You may receive vaccines as individual doses or as more than one vaccine together in one shot (combination vaccines). Talk with your health care provider about the risks and benefits of combination vaccines. What tests do I need? Blood tests  Lipid and cholesterol levels. These may be checked every 5 years, or more frequently if you are over 55 years old.  Hepatitis C test.  Hepatitis B test. Screening  Lung cancer screening. You may have this screening every year starting at age 79 if you  have a 30-pack-year history of smoking and currently smoke or have quit within the past 15 years.  Prostate cancer screening. Recommendations will vary depending on your family history and other risks.  Colorectal cancer screening. All adults should have this screening starting at age 49 and continuing until age 9. Your  health care provider may recommend screening at age 31 if you are at increased risk. You will have tests every 1-10 years, depending on your results and the type of screening test.  Diabetes screening. This is done by checking your blood sugar (glucose) after you have not eaten for a while (fasting). You may have this done every 1-3 years.  Sexually transmitted disease (STD) testing. Follow these instructions at home: Eating and drinking  Eat a diet that includes fresh fruits and vegetables, whole grains, lean protein, and low-fat dairy products.  Take vitamin and mineral supplements as recommended by your health care provider.  Do not drink alcohol if your health care provider tells you not to drink.  If you drink alcohol: ? Limit how much you have to 0-2 drinks a day. ? Be aware of how much alcohol is in your drink. In the [place] one drink equals one 12 oz bottle of beer (355 mL), one 5 oz glass of wine (148 mL), or one 1 oz glass of hard liquor (44 mL). Lifestyle  Take daily care of your teeth and gums.  Stay active. Exercise for at least 30 minutes on 5 or more days each week.  Do not use any products that contain nicotine or tobacco, such as cigarettes, e-cigarettes, and chewing tobacco. If you need help quitting, ask your health care provider.  If you are sexually active, practice safe sex. Use a condom or other form of protection to prevent STIs (sexually transmitted infections).  Talk with your health care provider about taking a low-dose aspirin every day starting at age 56. What's next?  Go to your health care provider once a year for a well check visit.  Ask your health care provider how often you should have your eyes and teeth checked.  Stay up to date on all vaccines. This information is not intended to replace advice given to you by your health care provider. Make sure you discuss any questions you have with your health care provider. Document Revised: 02/09/2018  Document Reviewed: 02/09/2018 Elsevier Patient Education  2020 [employer].

## 2019-05-12 NOTE — Assessment & Plan Note (Signed)
Overall stable, but seems abnormal for his age.  He does not use NSAIDs.  Given his history of bladder cancer coupled with his cigarette use, we will obtain an ultrasound of his kidneys for further insight.  I also expressed the absolute need to start drinking water, he verbalized understanding.

## 2019-05-12 NOTE — Assessment & Plan Note (Signed)
Immunizations up-to-date, due for second Shingrix vaccine today. PSA up-to-date. Colonoscopy up-to-date per patient, will again fax for records from [facility]. Encouraged healthy diet, encouraged the absolute need to drink water. Exam today unremarkable. Labs reviewed.

## 2019-05-12 NOTE — Assessment & Plan Note (Signed)
Doing well on sildenafil, refill sent to pharmacy.

## 2019-05-12 NOTE — Assessment & Plan Note (Signed)
Following with urology, recent reduction in testosterone to 0.5 mils.  Recent CBC with increased hematocrit.  He will be going for phlebotomy very soon.

## 2019-05-12 NOTE — Assessment & Plan Note (Signed)
Chronic use of PPI, doing well, prefers to continue.

## 2019-05-12 NOTE — Assessment & Plan Note (Signed)
Follows with urology semiannually, due for cystoscopy again seen.

## 2019-05-12 NOTE — Assessment & Plan Note (Signed)
LDL above goal.  ASCVD risk score of 20%. Statin intolerant.  Discussed his risk for heart disease/stroke given his tobacco abuse history and current LDL level.  He does agree to trying something like Zetia, prescription sent to pharmacy.  Repeat lipids in 2 months.

## 2019-05-16 ENCOUNTER — Telehealth: Payer: Self-pay | Admitting: Primary Care

## 2019-05-16 ENCOUNTER — Ambulatory Visit
Admission: RE | Admit: 2019-05-16 | Discharge: 2019-05-16 | Disposition: A | Discharge status: Home / Self Care | Payer: 59 | Source: Ambulatory Visit | Attending: Primary Care | Admitting: Primary Care

## 2019-05-16 DIAGNOSIS — Z1211 Encounter for screening for malignant neoplasm of colon: Secondary | ICD-10-CM

## 2019-05-16 DIAGNOSIS — N289 Disorder of kidney and ureter, unspecified: Secondary | ICD-10-CM

## 2019-05-16 NOTE — Telephone Encounter (Signed)
Please notify patient that I received a fax back from [facility] stating that they did not have records of him being a patient.  They do not have the colonoscopy report as we have discussed.  Where else can we fax for his colonoscopy report?  I have his signed medical record release form and will put it in Chan's in box.

## 2019-05-16 NOTE — Telephone Encounter (Signed)
Message left for patient to return my call.  

## 2019-05-17 ENCOUNTER — Encounter: Payer: Self-pay | Admitting: *Deleted

## 2019-05-17 ENCOUNTER — Telehealth: Payer: Self-pay | Admitting: *Deleted

## 2019-05-17 NOTE — Telephone Encounter (Signed)
Attempted to contact regarding lung screening scan. However there is no answer or voicemail option available.

## 2019-05-18 NOTE — Telephone Encounter (Signed)
Message left for patient to return my call.  

## 2019-05-21 NOTE — Telephone Encounter (Signed)
Attempted to contact regarding lung screening scan. However there is no answer or voicemail option.  

## 2019-05-24 ENCOUNTER — Telehealth: Payer: Self-pay | Admitting: *Deleted

## 2019-05-24 DIAGNOSIS — Z87891 Personal history of nicotine dependence: Secondary | ICD-10-CM

## 2019-05-24 NOTE — Addendum Note (Signed)
Addended by: Lieutenant Diego on: 05/24/2019 10:31 AM   Modules accepted: Orders

## 2019-05-24 NOTE — Telephone Encounter (Signed)
Call returned: Received referral for initial lung cancer screening scan. Contacted patient and obtained smoking history,(current, 45 pack year) as well as answering questions related to screening process. Patient denies signs of lung cancer such as weight loss or hemoptysis. Patient denies comorbidity that would prevent curative treatment if lung cancer were found. Patient is scheduled for shared decision making visit and CT scan on 05/30/19 at 1045am.

## 2019-05-24 NOTE — Telephone Encounter (Signed)
Patient stated to go ahead and place a referral to get his coloscopy done.

## 2019-05-24 NOTE — Telephone Encounter (Signed)
Received referral for low dose lung cancer screening CT scan. Message left at phone number listed in EMR for patient to call me back to facilitate scheduling scan.  

## 2019-05-25 NOTE — Telephone Encounter (Signed)
Noted, referral placed.  

## 2019-05-30 ENCOUNTER — Ambulatory Visit
Admission: RE | Admit: 2019-05-30 | Discharge: 2019-05-30 | Disposition: A | Discharge status: Home / Self Care | Payer: 59 | Source: Ambulatory Visit | Attending: Oncology | Admitting: Oncology

## 2019-05-30 ENCOUNTER — Inpatient Hospital Stay: Payer: 59 | Attending: Oncology | Admitting: Hospice and Palliative Medicine

## 2019-05-30 ENCOUNTER — Encounter: Payer: Self-pay | Admitting: Hospice and Palliative Medicine

## 2019-05-30 ENCOUNTER — Other Ambulatory Visit: Payer: Self-pay

## 2019-05-30 DIAGNOSIS — Z87891 Personal history of nicotine dependence: Secondary | ICD-10-CM

## 2019-05-30 NOTE — Progress Notes (Signed)
In accordance with CMS guidelines, patient has met eligibility criteria including age, absence of signs or symptoms of lung cancer.  Social History   Tobacco Use  . Smoking status: Current Every Day Smoker    Packs/day: 1.00    Years: 45.00    Pack years: 45.00    Types: Cigarettes  . Smokeless tobacco: Never Used  Substance Use Topics  . Alcohol use: Yes    Alcohol/week: 0.0 standard drinks    Comment: very rarely  . Drug use: No      A shared decision-making session was conducted prior to the performance of CT scan. This includes one or more decision aids, includes benefits and harms of screening, follow-up diagnostic testing, over-diagnosis, false positive rate, and total radiation exposure.   Counseling on the importance of adherence to annual lung cancer LDCT screening, impact of co-morbidities, and ability or willingness to undergo diagnosis and treatment is imperative for compliance of the program.   Counseling on the importance of continued smoking cessation for former smokers; the importance of smoking cessation for current smokers, and information about tobacco cessation interventions have been given to patient including [place] and [facility] quit [place] programs.   Written order for lung cancer screening with LDCT has been given to the patient and any and all questions have been answered to the best of my abilities.    Yearly follow up will be coordinated by Burgess Estelle, Thoracic Navigator.  Time Total: 15 minutes  Visit consisted of counseling and education dealing with complex health screening. Greater than 50%  of this time was spent counseling and coordinating care related to the above assessment and plan.  Signed by: Altha Harm, PhD, NP-C [phone] (Work Cell)

## 2019-06-01 ENCOUNTER — Encounter: Payer: Self-pay | Admitting: *Deleted

## 2019-10-24 ENCOUNTER — Other Ambulatory Visit: Payer: Self-pay | Admitting: Primary Care

## 2019-10-24 DIAGNOSIS — N529 Male erectile dysfunction, unspecified: Secondary | ICD-10-CM

## 2019-12-17 ENCOUNTER — Other Ambulatory Visit: Payer: Self-pay | Admitting: Primary Care

## 2019-12-17 DIAGNOSIS — N529 Male erectile dysfunction, unspecified: Secondary | ICD-10-CM

## 2019-12-19 NOTE — Telephone Encounter (Signed)
LAST APPOINTMENT DATE: 05/30/2019   NEXT APPOINTMENT DATE: Visit date not found    LAST REFILL: 10/24/2019  QTY: 90  0 rf

## 2019-12-19 NOTE — Telephone Encounter (Signed)
Requested Prescriptions   Signed Prescriptions Disp Refills  . sildenafil (REVATIO) 20 MG tablet 90 tablet 1    Sig: TAKE 2 TO 5 TABLETS BY MOUTH 60 MINUTES PRIOR TO INTERCOURSE    Authorizing Provider: Pleas Koch   Refill(s) sent to pharmacy.

## 2020-01-29 ENCOUNTER — Telehealth: Payer: Self-pay | Admitting: Primary Care

## 2020-01-29 DIAGNOSIS — N529 Male erectile dysfunction, unspecified: Secondary | ICD-10-CM

## 2020-01-29 MED ORDER — SILDENAFIL CITRATE 20 MG PO TABS: ORAL_TABLET | ORAL | 1 refills | Status: DC

## 2020-01-29 NOTE — Telephone Encounter (Signed)
Noted, Rx sent to [facility].

## 2020-01-29 NOTE — Telephone Encounter (Signed)
Ok to fill 

## 2020-01-29 NOTE — Addendum Note (Signed)
Addended by: Pleas Koch on: 01/29/2020 04:01 PM   Modules accepted: Orders

## 2020-01-29 NOTE — Telephone Encounter (Signed)
Patient is wanting to change pharmacies. Please send to [facility].   He needs a refill on sildenafil EM

## 2020-03-04 ENCOUNTER — Telehealth: Payer: Self-pay | Admitting: Primary Care

## 2020-03-04 NOTE — Telephone Encounter (Signed)
Error

## 2020-03-25 ENCOUNTER — Other Ambulatory Visit: Payer: Self-pay | Admitting: Primary Care

## 2020-03-25 DIAGNOSIS — N529 Male erectile dysfunction, unspecified: Secondary | ICD-10-CM

## 2020-03-27 ENCOUNTER — Other Ambulatory Visit: Payer: Self-pay | Admitting: Primary Care

## 2020-03-27 DIAGNOSIS — N529 Male erectile dysfunction, unspecified: Secondary | ICD-10-CM

## 2020-03-27 NOTE — Telephone Encounter (Signed)
Please find out what is going on with his sildenafil. I just sent refills in November 2021, #90 with 1 refill.  This is an as needed medication, how many pills is he taking at a time and how often is using this medication?  Does he actually need a refill? If he is taking this medication daily, then something like Cialis would be much better.

## 2020-03-28 NOTE — Telephone Encounter (Signed)
Left message to return call to our office.  

## 2020-04-02 NOTE — Telephone Encounter (Signed)
Patient returned call. Please call patient back at [phone].

## 2020-04-03 ENCOUNTER — Other Ambulatory Visit: Payer: Self-pay | Admitting: Primary Care

## 2020-04-03 NOTE — Telephone Encounter (Signed)
Pt called in wanted to know about getting a refill, he takes it 3 times a week

## 2020-04-03 NOTE — Telephone Encounter (Signed)
Refills sent to pharmacy. 

## 2020-05-06 ENCOUNTER — Telehealth: Payer: Self-pay | Admitting: Primary Care

## 2020-05-06 DIAGNOSIS — E785 Hyperlipidemia, unspecified: Secondary | ICD-10-CM

## 2020-05-06 DIAGNOSIS — E349 Endocrine disorder, unspecified: Secondary | ICD-10-CM

## 2020-05-06 DIAGNOSIS — Z125 Encounter for screening for malignant neoplasm of prostate: Secondary | ICD-10-CM

## 2020-05-06 NOTE — Addendum Note (Signed)
Addended by: Pleas Koch on: 05/06/2020 04:25 PM   Modules accepted: Orders

## 2020-05-06 NOTE — Telephone Encounter (Signed)
Has lab on 3/25 ok to add lab?

## 2020-05-06 NOTE — Telephone Encounter (Signed)
Added testosterone level to [facility] labs.

## 2020-05-06 NOTE — Telephone Encounter (Signed)
Gregory Good stated that she has a [facility] appointment on 4/5 and when Gregory Good come in for his labs please check the Testerone levels and he has been feeeling really tired, and you can let her know or him so can get the Testerone from his urologist

## 2020-05-08 ENCOUNTER — Telehealth: Payer: Self-pay | Admitting: *Deleted

## 2020-05-08 NOTE — Telephone Encounter (Signed)
Attempted to contact patient to schedule lung screening. Left message to call Shawn at [phone]. 

## 2020-05-09 ENCOUNTER — Telehealth: Payer: Self-pay | Admitting: Licensed Clinical Social Worker

## 2020-05-09 NOTE — Telephone Encounter (Signed)
Left message for patient to call [phone] to schedule lung screening CT scan.

## 2020-05-12 ENCOUNTER — Telehealth: Payer: Self-pay | Admitting: *Deleted

## 2020-05-12 NOTE — Telephone Encounter (Signed)
Attempted to contact patient to schedule lung screening. Left message to call Shawn at [phone]. 

## 2020-05-16 ENCOUNTER — Other Ambulatory Visit (INDEPENDENT_AMBULATORY_CARE_PROVIDER_SITE_OTHER): Payer: 59

## 2020-05-16 ENCOUNTER — Other Ambulatory Visit: Payer: Self-pay

## 2020-05-16 DIAGNOSIS — E349 Endocrine disorder, unspecified: Secondary | ICD-10-CM | POA: Diagnosis not present

## 2020-05-16 DIAGNOSIS — Z125 Encounter for screening for malignant neoplasm of prostate: Secondary | ICD-10-CM

## 2020-05-16 DIAGNOSIS — E785 Hyperlipidemia, unspecified: Secondary | ICD-10-CM | POA: Diagnosis not present

## 2020-05-16 LAB — COMPREHENSIVE METABOLIC PANEL
ALT: 15 U/L (ref 0–53)
AST: 14 U/L (ref 0–37)
Albumin: 4.3 g/dL (ref 3.5–5.2)
Alkaline Phosphatase: 50 U/L (ref 39–117)
BUN: 16 mg/dL (ref 6–23)
CO2: 30 mEq/L (ref 19–32)
Calcium: 9.4 mg/dL (ref 8.4–10.5)
Chloride: 101 mEq/L (ref 96–112)
Creatinine, Ser: 1.49 mg/dL (ref 0.40–1.50)
GFR: 49.67 mL/min — ABNORMAL LOW (ref >60.00)
Glucose, Bld: 89 mg/dL (ref 70–99)
Potassium: 4.7 mEq/L (ref 3.5–5.1)
Sodium: 139 mEq/L (ref 135–145)
Total Bilirubin: 0.6 mg/dL (ref 0.2–1.2)
Total Protein: 6.8 g/dL (ref 6.0–8.3)

## 2020-05-16 LAB — CBC
HCT: 51.1 % (ref 39.0–52.0)
Hemoglobin: 17.5 g/dL — ABNORMAL HIGH (ref 13.0–17.0)
MCHC: 34.2 g/dL (ref 30.0–36.0)
MCV: 90.8 fl (ref 78.0–100.0)
Platelets: 231 10*3/uL (ref 150.0–400.0)
RBC: 5.62 Mil/uL (ref 4.22–5.81)
RDW: 13.8 % (ref 11.5–15.5)
WBC: 7.2 10*3/uL (ref 4.0–10.5)

## 2020-05-16 LAB — PSA: PSA: 3.24 ng/mL (ref 0.10–4.00)

## 2020-05-16 LAB — LIPID PANEL
Cholesterol: 185 mg/dL (ref 0–200)
HDL: 28.1 mg/dL — ABNORMAL LOW (ref >39.00)
NonHDL: 157.18
Total CHOL/HDL Ratio: 7
Triglycerides: 244 mg/dL — ABNORMAL HIGH (ref 0.0–149.0)
VLDL: 48.8 mg/dL — ABNORMAL HIGH (ref 0.0–40.0)

## 2020-05-16 LAB — LDL CHOLESTEROL, DIRECT: Direct LDL: 132 mg/dL

## 2020-05-16 LAB — HEMOGLOBIN A1C: Hgb A1c MFr Bld: 5.5 % (ref 4.6–6.5)

## 2020-05-16 LAB — TESTOSTERONE: Testosterone: 351.27 ng/dL (ref 300.00–890.00)

## 2020-05-23 ENCOUNTER — Other Ambulatory Visit: Payer: 59

## 2020-06-18 ENCOUNTER — Encounter: Payer: Self-pay | Admitting: Primary Care

## 2020-06-18 ENCOUNTER — Ambulatory Visit (INDEPENDENT_AMBULATORY_CARE_PROVIDER_SITE_OTHER): Payer: 59 | Admitting: Primary Care

## 2020-06-18 ENCOUNTER — Other Ambulatory Visit: Payer: Self-pay

## 2020-06-18 VITALS — BP 118/62 | HR 81 | Temp 98.3°F | Ht 70.0 in | Wt 182.0 lb

## 2020-06-18 DIAGNOSIS — Z1211 Encounter for screening for malignant neoplasm of colon: Secondary | ICD-10-CM

## 2020-06-18 DIAGNOSIS — N529 Male erectile dysfunction, unspecified: Secondary | ICD-10-CM

## 2020-06-18 DIAGNOSIS — C679 Malignant neoplasm of bladder, unspecified: Secondary | ICD-10-CM

## 2020-06-18 DIAGNOSIS — Z0000 Encounter for general adult medical examination without abnormal findings: Secondary | ICD-10-CM

## 2020-06-18 DIAGNOSIS — N289 Disorder of kidney and ureter, unspecified: Secondary | ICD-10-CM

## 2020-06-18 DIAGNOSIS — E785 Hyperlipidemia, unspecified: Secondary | ICD-10-CM

## 2020-06-18 DIAGNOSIS — K219 Gastro-esophageal reflux disease without esophagitis: Secondary | ICD-10-CM | POA: Diagnosis not present

## 2020-06-18 DIAGNOSIS — E349 Endocrine disorder, unspecified: Secondary | ICD-10-CM

## 2020-06-18 NOTE — Assessment & Plan Note (Addendum)
Little to no water intake, does drink a lot of iced tea.  Family history of renal cancer.   Renal ultrasound from 2021 negative. Follows with Urology annually.  Encouraged water intake. Continue to monitor.

## 2020-06-18 NOTE — Assessment & Plan Note (Signed)
Doing well on PRN sildenafil 20 mg. Continue same.

## 2020-06-18 NOTE — Progress Notes (Signed)
Subjective:    Patient ID: Gregory Good, male    DOB: Jan 30, 1956, 65 y.o.   MRN: [id number]  HPI  Gregory Good is a very pleasant 65 y.o. male who presents today for complete physical.  Immunizations: -Tetanus: 2019 -Influenza: Did not receive this season -Covid-19: Completed 2 vaccines -Shingles: Completed course  Diet: He endorses a fair diet Exercise: Active with work  Eye exam: Completed in 2021 Dental exam: Semi-annually  Colonoscopy: Completed in 2012 or 2013 in [place] PSA: 3.24  Lung Cancer Screening: Completed in 2021   BP Readings from Last 3 Encounters:  06/18/20 118/62  05/11/19 124/82  01/05/19 124/84      Review of Systems  Constitutional: Negative for unexpected weight change.  HENT: Negative for rhinorrhea.   Respiratory: Negative for cough and shortness of breath.   Cardiovascular: Negative for chest pain.  Gastrointestinal: Negative for constipation and diarrhea.  Genitourinary: Negative for difficulty urinating.  Musculoskeletal: Negative for arthralgias.  Skin: Negative for rash.  Allergic/Immunologic: Negative for environmental allergies.  Neurological: Negative for dizziness, numbness and headaches.  Psychiatric/Behavioral: The patient is not nervous/anxious.          Past Medical History:  Diagnosis Date  . Barrett's esophagus   . Bladder cancer [facility]) 2016   Follows with Urology  . Bladder cancer ([place])   . Chronic back pain   . GERD (gastroesophageal reflux disease)   . Hyperlipidemia   . IBS (irritable bowel syndrome)     Social History   Socioeconomic History  . Marital status: Married    Spouse name: Not on file  . Number of children: Not on file  . Years of education: Not on file  . Highest education level: Not on file  Occupational History  . Not on file  Tobacco Use  . Smoking status: Current Every Day Smoker    Packs/day: 1.00    Years: 45.00    Pack years: 45.00    Types: Cigarettes  . Smokeless  tobacco: Never Used  Substance and Sexual Activity  . Alcohol use: Yes    Alcohol/week: 0.0 standard drinks    Comment: very rarely  . Drug use: No  . Sexual activity: Yes  Other Topics Concern  . Not on file  Social History Narrative   Married.    Works as Materials engineer.   Current smoker of 1-2 cigarettes per day.   Enjoys spending time with his family.   Social Determinants of Health   Financial Resource Strain: Not on file  Food Insecurity: Not on file  Transportation Needs: Not on file  Physical Activity: Not on file  Stress: Not on file  Social Connections: Not on file  Intimate Partner Violence: Not on file    Past Surgical History:  Procedure Laterality Date  . CYSTOSCOPY N/A 01/28/2015   Procedure: CYSTOSCOPY;  Surgeon: Irine Seal, MD;  Location: [facility];  Service: Urology;  Laterality: N/A;  . left hip surgery     30 years ago for bone spur   . right knee arthroscopy   2004  . TONSILLECTOMY AND ADENOIDECTOMY    . TRANSURETHRAL RESECTION OF BLADDER TUMOR N/A 01/28/2015   Procedure: TRANSURETHRAL RESECTION OF BLADDER TUMOR (TURBT) MITOMYCIN-C INSTILLATION;  Surgeon: Irine Seal, MD;  Location: [facility];  Service: Urology;  Laterality: N/A;    Family History  Problem Relation Age of Onset  . Kidney cancer Father     Allergies  Allergen Reactions  . Zetia [Ezetimibe] Other (See Comments)  Joint pain     Current Outpatient Medications on File Prior to Visit  Medication Sig Dispense Refill  . esomeprazole (NEXIUM) 20 MG capsule TAKE 1 CAPSULE BY MOUTH TWICE DAILY BEFORE A MEAL 180 capsule 1  . fexofenadine (ALLEGRA) 30 MG tablet Take 30 mg by mouth daily as needed (for allergies).     . sildenafil (REVATIO) 20 MG tablet TAKE 2 TO 5 TABLETS BY MOUTH 60 MINUTES PRIOR TO INTERCOURSE 90 tablet 5  . testosterone cypionate (DEPOTESTOSTERONE CYPIONATE) 200 MG/ML injection Inject 0.5 ml into the muscle once weekly. 3 mL 0   No current facility-administered medications on  file prior to visit.    BP 118/62   Pulse 81   Temp 98.3 F (36.8 C) (Temporal)   Ht 5\' 10"  (1.778 m)   Wt 182 lb (82.6 kg)   SpO2 96%   BMI 26.11 kg/m  Objective:   Physical Exam HENT:     Right Ear: Tympanic membrane and ear canal normal.     Left Ear: Tympanic membrane and ear canal normal.     Nose: Nose normal.     Right Sinus: No maxillary sinus tenderness or frontal sinus tenderness.     Left Sinus: No maxillary sinus tenderness or frontal sinus tenderness.  Eyes:     Conjunctiva/sclera: Conjunctivae normal.     Pupils: Pupils are equal, round, and reactive to light.  Neck:     Thyroid: No thyromegaly.     Vascular: No carotid bruit.  Cardiovascular:     Rate and Rhythm: Normal rate and regular rhythm.     Heart sounds: Normal heart sounds.  Pulmonary:     Effort: Pulmonary effort is normal.     Breath sounds: Normal breath sounds. No wheezing or rales.  Abdominal:     General: Bowel sounds are normal.     Palpations: Abdomen is soft.     Tenderness: There is no abdominal tenderness.  Musculoskeletal:        General: Normal range of motion.     Cervical back: Neck supple.  Skin:    General: Skin is warm and dry.  Neurological:     Mental Status: He is alert and oriented to person, place, and time.     Cranial Nerves: No cranial nerve deficit.     Deep Tendon Reflexes: Reflexes are normal and symmetric.  Psychiatric:        Mood and Affect: Mood normal.           Assessment & Plan:      This visit occurred during the SARS-CoV-2 public health emergency.  Safety protocols were in place, including screening questions prior to the visit, additional usage of staff PPE, and extensive cleaning of exam room while observing appropriate contact time as indicated for disinfecting solutions.

## 2020-06-18 NOTE — Patient Instructions (Signed)
Call to schedule your lung cancer screening [phone].  You will be contacted regarding your referral to GI for the colonoscopy.  Please let us know if you have not been contacted within two weeks.   Start exercising. You should be getting 150 minutes of moderate intensity exercise weekly.  It's important to improve your diet by reducing consumption of fast food, fried food, processed snack foods, sugary drinks. Increase consumption of fresh vegetables and fruits, whole grains, water.  Ensure you are drinking 64 ounces of water daily.  It was a pleasure to see you today!

## 2020-06-18 NOTE — Assessment & Plan Note (Signed)
Follows with Urology, continue current testosterone regimen.

## 2020-06-18 NOTE — Assessment & Plan Note (Signed)
Immunizations UTD.  PSA UTD. Colonoscopy due, referral placed to GI. Lung cancer screening due, he will call to schedule.   Discussed the importance of a healthy diet and regular exercise in order for weight loss, and to reduce the risk of any potential medical problems.  Exam today stable. Labs reviewed.

## 2020-06-18 NOTE — Assessment & Plan Note (Signed)
Could not tolerate Zetia or statins. Encouraged a healthy diet, regular exercise.  LDL above goal. Continue to monitor.

## 2020-06-18 NOTE — Assessment & Plan Note (Signed)
In remission, follows with Urology annually. Continue to monitor.

## 2020-06-18 NOTE — Assessment & Plan Note (Signed)
Overall doing well on Nexium 20 mg, no breakthrough symptoms. Continue same.

## 2020-06-25 NOTE — Telephone Encounter (Signed)
I have no issue with [facility] receiving his labs, just not sure about the process for this. Can you respond to him?

## 2020-07-02 ENCOUNTER — Encounter: Payer: Self-pay | Admitting: *Deleted

## 2020-10-13 ENCOUNTER — Other Ambulatory Visit: Payer: Self-pay | Admitting: Primary Care

## 2020-10-13 DIAGNOSIS — N529 Male erectile dysfunction, unspecified: Secondary | ICD-10-CM

## 2020-10-14 NOTE — Telephone Encounter (Signed)
Please clarify what dose patient is taking

## 2020-10-14 NOTE — Telephone Encounter (Signed)
Left message to return call to our office.  

## 2020-10-14 NOTE — Telephone Encounter (Signed)
LAST APPOINTMENT DATE: 06/18/2020   NEXT APPOINTMENT DATE: Visit date not found    LAST REFILL: 04-03-2020   QTY: #90 5 rf

## 2020-10-17 NOTE — Telephone Encounter (Signed)
He is taking 20 mg 5 at a time?

## 2020-12-11 ENCOUNTER — Other Ambulatory Visit: Payer: Self-pay

## 2020-12-11 ENCOUNTER — Encounter: Payer: Self-pay | Admitting: Family Medicine

## 2020-12-11 ENCOUNTER — Telehealth (INDEPENDENT_AMBULATORY_CARE_PROVIDER_SITE_OTHER): Payer: 59 | Admitting: Family Medicine

## 2020-12-11 VITALS — Temp 99.0°F | Ht 70.0 in | Wt 185.0 lb

## 2020-12-11 DIAGNOSIS — J069 Acute upper respiratory infection, unspecified: Secondary | ICD-10-CM | POA: Diagnosis not present

## 2020-12-11 MED ORDER — GUAIFENESIN-CODEINE 100-10 MG/5ML PO SYRP: 5.0000 mL | ORAL_SOLUTION | Freq: Every evening | ORAL | 0 refills | Status: DC | PRN

## 2020-12-11 NOTE — Assessment & Plan Note (Signed)
Acute   No chronic respiratory disease. Negative COVID test . Consider retesting on day 5 of illness to verify negative.  Treat as viral infection with rest, fluids and time. Can use prescription cough suppressant as needed.  Return precautions given.

## 2020-12-11 NOTE — Progress Notes (Signed)
TELEPHONE VISIT  Due to national recommendations of social distancing due to [place] 19, Audio telehealth visit is felt to be most appropriate for this patient at this time.   I connected with Gregory Good on 12/11/20 at  3:40 PM EDT by telephone and verified that I am speaking with the correct person using two identifiers.   I discussed the limitations, risks, security and privacy concerns of performing an evaluation and management service by telephone and the availability of in person appointments. I also discussed with the patient that there may be a patient responsible charge related to this service. The patient expressed understanding and agreed to proceed.  Patient location: Home Provider Location: [facility] Participants: Gregory Good Gregory Good and Gregory Good   History of Present Illness: 65 year old male patient of Gregory Good presents with new onset ST, congestion progressing to cough.   Symptoms started  in last  3-4 days. Started 12/09/2020   No fever,  some body pain, no SOB.  Feeling tired.    Has facial pain, bilateral, especially forehead. No ear pain.  Having trouble sleeping at night given cough, productive.  He has tried benadryl and Nyquil.   COVID 19 screen COVID testing: home 10/12 COVID vaccine: has had primary series COVID exposure: No recent travel or known exposure to [place]  The importance of social distancing was discussed today.    Review of Systems  Constitutional:  Negative for chills and fever.  HENT:  Positive for congestion. Negative for ear pain.   Eyes:  Negative for pain and redness.  Respiratory:  Positive for cough. Negative for shortness of breath.   Cardiovascular:  Negative for chest pain, palpitations and leg swelling.  Gastrointestinal:  Negative for abdominal pain, blood in stool, constipation, diarrhea, nausea and vomiting.  Genitourinary:  Negative for dysuria.  Musculoskeletal:  Negative for falls and myalgias.  Skin:   Negative for rash.  Neurological:  Negative for dizziness.  Psychiatric/Behavioral:  Negative for depression. The patient is not nervous/anxious.      Past Medical History:  Diagnosis Date   Barrett's esophagus    Bladder cancer ([place]) 2016   Follows with Urology   Bladder cancer [facility])    Chronic back pain    GERD (gastroesophageal reflux disease)    Hyperlipidemia    IBS (irritable bowel syndrome)     reports that he has been smoking cigarettes. He has a 45.00 pack-year smoking history. He has never used smokeless tobacco. He reports current alcohol use. He reports that he does not use drugs.   Current Outpatient Medications:    esomeprazole (NEXIUM) 20 MG capsule, TAKE 1 CAPSULE BY MOUTH TWICE DAILY BEFORE A MEAL, Disp: 180 capsule, Rfl: 1   fexofenadine (ALLEGRA) 30 MG tablet, Take 30 mg by mouth daily as needed (for allergies). , Disp: , Rfl:    sildenafil (REVATIO) 20 MG tablet, TAKE 2 TO 5 TABLETS BY MOUTH 60 MINUTES PRIOR TO INTERCOURSE, Disp: 90 tablet, Rfl: 0   testosterone cypionate (DEPOTESTOSTERONE CYPIONATE) 200 MG/ML injection, Inject 0.5 ml into the muscle once weekly., Disp: 3 mL, Rfl: 0   Observations/Objective: Temperature 99 F (37.2 C), height 5\' 10"  (1.778 m), weight 185 lb (83.9 kg).  Physical Exam  Physical Exam Constitutional:      General: The patient is not in acute distress. Pulmonary:     Effort: Pulmonary effort is normal. No respiratory distress.  Neurological:     Mental Status: The patient is alert and oriented to  person, place, and time.  Psychiatric:        Mood and Affect: Mood normal.        Behavior: Behavior normal.   Assessment and Plan Problem List Items Addressed This Visit     Viral URI with cough - Primary    Acute   No chronic respiratory disease. Negative COVID test . Consider retesting on day 5 of illness to verify negative.  Treat as viral infection with rest, fluids and time. Can use prescription cough suppressant as  needed.  Return precautions given.       I discussed the assessment and treatment plan with the patient. The patient was provided an opportunity to ask questions and all were answered. The patient agreed with the plan and demonstrated an understanding of the instructions.   The patient was advised to call back or seek an in-person evaluation if the symptoms worsen or if the condition fails to improve as anticipated.  I provided 15 minutes of non-face-to-face time during this encounter.   Eliezer Lofts, MD

## 2020-12-11 NOTE — Progress Notes (Deleted)
VIRTUAL VISIT Due to national recommendations of social distancing due to [place] 19, a virtual visit is felt to be most appropriate for this patient at this time.   I connected with the patient on 12/11/20 at  3:40 PM EDT by virtual telehealth platform and verified that I am speaking with the correct person using two identifiers.   I discussed the limitations, risks, security and privacy concerns of performing an evaluation and management service by  virtual telehealth platform and the availability of in person appointments. I also discussed with the patient that there may be a patient responsible charge related to this service. The patient expressed understanding and agreed to proceed.  Patient location: Home Provider Location: [facility] Participants: Eliezer Lofts and Alice Reichert   Chief Complaint  Patient presents with   URI    Started with sore throat, drainage, then cough. Negative covid test. Cannot sleep due to cough. Has been taking Benadryl and Nyquil.    History of Present Illness:  65 year old male patient of Allie Bossier presents with new onset ST, congestion progressing to cough.,   Symptoms started  in last   COVID 19 screen COVID testing: COVID vaccine: COVID exposure: No recent travel or known exposure to [place]  The importance of social distancing was discussed today.    ROS    Past Medical History:  Diagnosis Date   Barrett's esophagus    Bladder cancer ([place]) 2016   Follows with Urology   Bladder cancer [facility])    Chronic back pain    GERD (gastroesophageal reflux disease)    Hyperlipidemia    IBS (irritable bowel syndrome)     reports that he has been smoking cigarettes. He has a 45.00 pack-year smoking history. He has never used smokeless tobacco. He reports current alcohol use. He reports that he does not use drugs.   Current Outpatient Medications:    esomeprazole (NEXIUM) 20 MG capsule, TAKE 1 CAPSULE BY MOUTH TWICE DAILY BEFORE A MEAL,  Disp: 180 capsule, Rfl: 1   fexofenadine (ALLEGRA) 30 MG tablet, Take 30 mg by mouth daily as needed (for allergies). , Disp: , Rfl:    sildenafil (REVATIO) 20 MG tablet, TAKE 2 TO 5 TABLETS BY MOUTH 60 MINUTES PRIOR TO INTERCOURSE, Disp: 90 tablet, Rfl: 0   testosterone cypionate (DEPOTESTOSTERONE CYPIONATE) 200 MG/ML injection, Inject 0.5 ml into the muscle once weekly., Disp: 3 mL, Rfl: 0   Observations/Objective: Temperature 99 F (37.2 C), height 5\' 10"  (1.778 m), weight 185 lb (83.9 kg).  Physical Exam   Assessment and Plan    I discussed the assessment and treatment plan with the patient. The patient was provided an opportunity to ask questions and all were answered. The patient agreed with the plan and demonstrated an understanding of the instructions.   The patient was advised to call back or seek an in-person evaluation if the symptoms worsen or if the condition fails to improve as anticipated.     Eliezer Lofts, MD

## 2020-12-18 ENCOUNTER — Other Ambulatory Visit: Payer: Self-pay

## 2020-12-18 DIAGNOSIS — N529 Male erectile dysfunction, unspecified: Secondary | ICD-10-CM

## 2020-12-18 MED ORDER — SILDENAFIL CITRATE 20 MG PO TABS: ORAL_TABLET | ORAL | 0 refills | Status: DC

## 2020-12-18 NOTE — Telephone Encounter (Signed)
Last refilled on 10/17/20 #90 with0 refill LOV 12/11/20 acute visit with Dr [name]  CPE on 06/18/20

## 2021-01-01 ENCOUNTER — Telehealth: Payer: Self-pay | Admitting: Primary Care

## 2021-01-01 NOTE — Telephone Encounter (Signed)
error 

## 2021-01-29 NOTE — Telephone Encounter (Signed)
It looks like he saw Dr. [name]. He needs an office visit now.

## 2021-02-10 ENCOUNTER — Encounter: Payer: Self-pay | Admitting: Primary Care

## 2021-02-10 ENCOUNTER — Other Ambulatory Visit: Payer: Self-pay

## 2021-02-10 ENCOUNTER — Telehealth (INDEPENDENT_AMBULATORY_CARE_PROVIDER_SITE_OTHER): Payer: 59 | Admitting: Primary Care

## 2021-02-10 VITALS — Ht 69.0 in | Wt 185.0 lb

## 2021-02-10 DIAGNOSIS — J3489 Other specified disorders of nose and nasal sinuses: Secondary | ICD-10-CM | POA: Diagnosis not present

## 2021-02-10 MED ORDER — PREDNISONE 20 MG PO TABS: ORAL_TABLET | ORAL | 0 refills | Status: DC

## 2021-02-10 NOTE — Patient Instructions (Signed)
Start prednisone 20 mg tablets. Take 2 tablets by mouth once daily for 5 days.  Try taking 1/2 tablet of Allegra twice daily.  Please update me early next week.  It was a pleasure to see you today!

## 2021-02-10 NOTE — Progress Notes (Signed)
Patient ID: Gregory Good, male    DOB: October 16, 1955, 65 y.o.   MRN: [id number]  Virtual visit completed through [place], a video enabled telemedicine application. Due to national recommendations of social distancing due to COVID-19, a virtual visit is felt to be most appropriate for this patient at this time. Reviewed limitations, risks, security and privacy concerns of performing a virtual visit and the availability of in person appointments. I also reviewed that there may be a patient responsible charge related to this service. The patient agreed to proceed.   Patient location: home Provider location: [place] at [facility], office Persons participating in this virtual visit: patient, provider   If any vitals were documented, they were collected by patient at home unless specified below.    Ht 5\' 9"  (1.753 m)    Wt 185 lb (83.9 kg)    BMI 27.32 kg/m    CC: Sinus Pressure Subjective:   HPI: Gregory Good is a 65 y.o. male with a history of GERD, bladder cancer, hyperlipidemia, decreased renal function presenting on 02/10/2021 to discuss post nasal drip and sinus pressure.  He also reports cough from post nasal drip, sinus pressure, rhinorrhea.  Symptom onset in October 2022, evaluated by Dr. [name] and diagnosed with viral URI with cough. Since then symptoms have persisted.   He has noticed temporary improvement with Allegra, Benadryl and Nyquil, worse at night or when changing positions, will cough for 10 minutes when laying down. He is compliant to his Allegra daily, has been taking for years. Is also compliant to Nexium 20 mg.   He completed a home Covid-19 test three weeks ago.  Overall he feels well. Denies fatigue, body aches, fevers.        Relevant past medical, surgical, family and social history reviewed and updated as indicated. Interim medical history since our last visit reviewed. Allergies and medications reviewed and updated. Outpatient Medications Prior to  Visit  Medication Sig Dispense Refill   esomeprazole (NEXIUM) 20 MG capsule TAKE 1 CAPSULE BY MOUTH TWICE DAILY BEFORE A MEAL 180 capsule 1   fexofenadine (ALLEGRA) 30 MG tablet Take 30 mg by mouth daily as needed (for allergies).      sildenafil (REVATIO) 20 MG tablet TAKE 2 TO 5 TABLETS BY MOUTH 60 MINUTES PRIOR TO INTERCOURSE 90 tablet 0   testosterone cypionate (DEPOTESTOSTERONE CYPIONATE) 200 MG/ML injection Inject 0.5 ml into the muscle once weekly. 3 mL 0   guaiFENesin-codeine (ROBITUSSIN AC) 100-10 MG/5ML syrup Take 5-10 mLs by mouth at bedtime as needed for cough. (Patient not taking: Reported on 02/10/2021) 180 mL 0   selenium sulfide (SELSUN) 2.5 % shampoo Apply topically.     No facility-administered medications prior to visit.     Per HPI unless specifically indicated in ROS section below Review of Systems  Constitutional:  Negative for chills, fatigue and fever.  HENT:  Positive for congestion, postnasal drip and sinus pressure.   Respiratory:  Positive for cough.   Objective:  Ht 5\' 9"  (1.753 m)    Wt 185 lb (83.9 kg)    BMI 27.32 kg/m   Wt Readings from Last 3 Encounters:  02/10/21 185 lb (83.9 kg)  12/11/20 185 lb (83.9 kg)  06/18/20 182 lb (82.6 kg)       Physical exam: General: Alert and oriented x 3, no distress, does not appear sickly  Pulmonary: Speaks in complete sentences without increased work of breathing, no cough during visit.  Psychiatric: Normal mood, thought content,  and behavior.     Results for orders placed or performed in visit on 05/16/20  PSA  Result Value Ref Range   PSA 3.24 0.10 - 4.00 ng/mL  Testosterone  Result Value Ref Range   Testosterone 351.27 300.00 - 890.00 ng/dL  CBC  Result Value Ref Range   WBC 7.2 4.0 - 10.5 K/uL   RBC 5.62 4.22 - 5.81 Mil/uL   Platelets 231.0 150.0 - 400.0 K/uL   Hemoglobin 17.5 (H) 13.0 - 17.0 g/dL   HCT 51.1 39.0 - 52.0 %   MCV 90.8 78.0 - 100.0 fl   MCHC 34.2 30.0 - 36.0 g/dL   RDW 13.8 11.5 -  15.5 %  Comprehensive metabolic panel  Result Value Ref Range   Sodium 139 135 - 145 mEq/L   Potassium 4.7 3.5 - 5.1 mEq/L   Chloride 101 96 - 112 mEq/L   CO2 30 19 - 32 mEq/L   Glucose, Bld 89 70 - 99 mg/dL   BUN 16 6 - 23 mg/dL   Creatinine, Ser 1.49 0.40 - 1.50 mg/dL   Total Bilirubin 0.6 0.2 - 1.2 mg/dL   Alkaline Phosphatase 50 39 - 117 U/L   AST 14 0 - 37 U/L   ALT 15 0 - 53 U/L   Total Protein 6.8 6.0 - 8.3 g/dL   Albumin 4.3 3.5 - 5.2 g/dL   GFR 49.67 (L) >60.00 mL/min   Calcium 9.4 8.4 - 10.5 mg/dL  Hemoglobin A1c  Result Value Ref Range   Hgb A1c MFr Bld 5.5 4.6 - 6.5 %  Lipid panel  Result Value Ref Range   Cholesterol 185 0 - 200 mg/dL   Triglycerides 244.0 (H) 0.0 - 149.0 mg/dL   HDL 28.10 (L) >39.00 mg/dL   VLDL 48.8 (H) 0.0 - 40.0 mg/dL   Total CHOL/HDL Ratio 7    NonHDL 157.18   LDL cholesterol, direct  Result Value Ref Range   Direct LDL 132.0 mg/dL   Assessment & Plan:   Problem List Items Addressed This Visit       Other   Sinus pressure - Primary    Symptoms mostly represent allergy involvement, especially given changes in the weather.  Discussed to try 1/2 tablet of Allegra twice daily. Will try Prednisone 40 mg daily x 5 days.  He doesn't appear to be sickly, especially after 2 months of symptoms. Continue Nexium 20 mg.  He will update next week.      Relevant Medications   predniSONE (DELTASONE) 20 MG tablet     Meds ordered this encounter  Medications   predniSONE (DELTASONE) 20 MG tablet    Sig: Take 2 tablets by mouth once daily for 5 days.    Dispense:  10 tablet    Refill:  0    Order Specific Question:   Supervising Provider    Answer:   BEDSOLE, AMY E [[id number]]   No orders of the defined types were placed in this encounter.   I discussed the assessment and treatment plan with the patient. The patient was provided an opportunity to ask questions and all were answered. The patient agreed with the plan and demonstrated an  understanding of the instructions. The patient was advised to call back or seek an in-person evaluation if the symptoms worsen or if the condition fails to improve as anticipated.  Follow up plan:  Start prednisone 20 mg tablets. Take 2 tablets by mouth once daily for 5 days.  Try taking 1/2  tablet of Allegra twice daily.  Please update me early next week.  It was a pleasure to see you today!   Pleas Koch, NP

## 2021-02-10 NOTE — Assessment & Plan Note (Signed)
Symptoms mostly represent allergy involvement, especially given changes in the weather.  Discussed to try 1/2 tablet of Allegra twice daily. Will try Prednisone 40 mg daily x 5 days.  He doesn't appear to be sickly, especially after 2 months of symptoms. Continue Nexium 20 mg.  He will update next week.

## 2021-02-13 ENCOUNTER — Other Ambulatory Visit: Payer: Self-pay | Admitting: Primary Care

## 2021-02-13 DIAGNOSIS — N529 Male erectile dysfunction, unspecified: Secondary | ICD-10-CM

## 2021-03-12 DIAGNOSIS — J3489 Other specified disorders of nose and nasal sinuses: Secondary | ICD-10-CM

## 2021-03-17 MED ORDER — AMOXICILLIN-POT CLAVULANATE 875-125 MG PO TABS: 1.0000 | ORAL_TABLET | Freq: Two times a day (BID) | ORAL | 0 refills | Status: DC

## 2021-03-18 ENCOUNTER — Other Ambulatory Visit: Payer: Self-pay | Admitting: Primary Care

## 2021-03-18 DIAGNOSIS — N529 Male erectile dysfunction, unspecified: Secondary | ICD-10-CM

## 2021-05-19 ENCOUNTER — Telehealth: Payer: Self-pay

## 2021-05-19 ENCOUNTER — Telehealth: Payer: Self-pay | Admitting: Gastroenterology

## 2021-05-19 ENCOUNTER — Other Ambulatory Visit: Payer: Self-pay | Admitting: Primary Care

## 2021-05-19 DIAGNOSIS — Z1211 Encounter for screening for malignant neoplasm of colon: Secondary | ICD-10-CM

## 2021-05-19 NOTE — Telephone Encounter (Signed)
Patients spouse called wanting to schedule and upper EGD and a colonoscopy. Requesting call back. ?

## 2021-05-19 NOTE — Telephone Encounter (Signed)
SCHEDULED FOR 09/15/2021 ?

## 2021-05-21 ENCOUNTER — Telehealth: Payer: Self-pay

## 2021-05-21 NOTE — Telephone Encounter (Signed)
CALLED PATIENT NO ANSWER LEFT VOICEMAIL FOR A CALL BACK ?VANGA ?

## 2021-07-09 ENCOUNTER — Other Ambulatory Visit: Payer: Self-pay | Admitting: Primary Care

## 2021-07-09 ENCOUNTER — Encounter: Payer: Self-pay | Admitting: Primary Care

## 2021-07-09 ENCOUNTER — Ambulatory Visit (INDEPENDENT_AMBULATORY_CARE_PROVIDER_SITE_OTHER): Payer: 59 | Admitting: Primary Care

## 2021-07-09 VITALS — BP 120/76 | HR 81 | Temp 98.6°F | Ht 69.0 in | Wt 192.0 lb

## 2021-07-09 DIAGNOSIS — Z122 Encounter for screening for malignant neoplasm of respiratory organs: Secondary | ICD-10-CM

## 2021-07-09 DIAGNOSIS — E349 Endocrine disorder, unspecified: Secondary | ICD-10-CM

## 2021-07-09 DIAGNOSIS — K219 Gastro-esophageal reflux disease without esophagitis: Secondary | ICD-10-CM

## 2021-07-09 DIAGNOSIS — E785 Hyperlipidemia, unspecified: Secondary | ICD-10-CM

## 2021-07-09 DIAGNOSIS — N401 Enlarged prostate with lower urinary tract symptoms: Secondary | ICD-10-CM

## 2021-07-09 DIAGNOSIS — Z8551 Personal history of malignant neoplasm of bladder: Secondary | ICD-10-CM

## 2021-07-09 DIAGNOSIS — R3912 Poor urinary stream: Secondary | ICD-10-CM

## 2021-07-09 DIAGNOSIS — Z0000 Encounter for general adult medical examination without abnormal findings: Secondary | ICD-10-CM

## 2021-07-09 DIAGNOSIS — N529 Male erectile dysfunction, unspecified: Secondary | ICD-10-CM

## 2021-07-09 DIAGNOSIS — N400 Enlarged prostate without lower urinary tract symptoms: Secondary | ICD-10-CM | POA: Insufficient documentation

## 2021-07-09 NOTE — Assessment & Plan Note (Signed)
In remission, follows with Urology. ?Recent underwent cystoscopy per Dr. Jeffie Pollock.  ?

## 2021-07-09 NOTE — Patient Instructions (Addendum)
Stop by the lab prior to leaving today. I will notify you of your results once received.  ? ?Complete your colonoscopy as scheduled. ? ?You will be contacted regarding your referral for lung cancer screening.  Please let us know if you have not been contacted within two weeks.  ? ?It was a pleasure to see you today! ? ?Preventive Care 65-65 Years Old, Male ?Preventive care refers to lifestyle choices and visits with your health care provider that can promote health and wellness. Preventive care visits are also called wellness exams. ?What can I expect for my preventive care visit? ?Counseling ?During your preventive care visit, your health care provider may ask about your: ?Medical history, including: ?Past medical problems. ?Family medical history. ?Current health, including: ?Emotional well-being. ?Home life and relationship well-being. ?Sexual activity. ?Lifestyle, including: ?Alcohol, nicotine or tobacco, and drug use. ?Access to firearms. ?Diet, exercise, and sleep habits. ?Safety issues such as seatbelt and bike helmet use. ?Sunscreen use. ?Work and work Statistician. ?Physical exam ?Your health care provider will check your: ?Height and weight. These may be used to calculate your BMI (body mass index). BMI is a measurement that tells if you are at a healthy weight. ?Waist circumference. This measures the distance around your waistline. This measurement also tells if you are at a healthy weight and may help predict your risk of certain diseases, such as type 2 diabetes and high blood pressure. ?Heart rate and blood pressure. ?Body temperature. ?Skin for abnormal spots. ?What immunizations do I need? ? ?Vaccines are usually given at various ages, according to a schedule. Your health care provider will recommend vaccines for you based on your age, medical history, and lifestyle or other factors, such as travel or where you work. ?What tests do I need? ?Screening ?Your health care provider may recommend screening  tests for certain conditions. This may include: ?Lipid and cholesterol levels. ?Diabetes screening. This is done by checking your blood sugar (glucose) after you have not eaten for a while (fasting). ?Hepatitis B test. ?Hepatitis C test. ?HIV (human immunodeficiency virus) test. ?STI (sexually transmitted infection) testing, if you are at risk. ?Lung cancer screening. ?Prostate cancer screening. ?Colorectal cancer screening. ?Talk with your health care provider about your test results, treatment options, and if necessary, the need for more tests. ?Follow these instructions at home: ?Eating and drinking ? ?Eat a diet that includes fresh fruits and vegetables, whole grains, lean protein, and low-fat dairy products. ?Take vitamin and mineral supplements as recommended by your health care provider. ?Do not drink alcohol if your health care provider tells you not to drink. ?If you drink alcohol: ?Limit how much you have to 0-2 drinks a day. ?Know how much alcohol is in your drink. In the [place] one drink equals one 12 oz bottle of beer (355 mL), one 5 oz glass of wine (148 mL), or one 1? oz glass of hard liquor (44 mL). ?Lifestyle ?Brush your teeth every morning and night with fluoride toothpaste. Floss one time each day. ?Exercise for at least 30 minutes 5 or more days each week. ?Do not use any products that contain nicotine or tobacco. These products include cigarettes, chewing tobacco, and vaping devices, such as e-cigarettes. If you need help quitting, ask your health care provider. ?Do not use drugs. ?If you are sexually active, practice safe sex. Use a condom or other form of protection to prevent STIs. ?Take aspirin only as told by your health care provider. Make sure that you understand how much  to take and what form to take. Work with your health care provider to find out whether it is safe and beneficial for you to take aspirin daily. ?Find healthy ways to manage stress, such as: ?Meditation, yoga, or listening  to music. ?Journaling. ?Talking to a trusted person. ?Spending time with friends and family. ?Minimize exposure to UV radiation to reduce your risk of skin cancer. ?Safety ?Always wear your seat belt while driving or riding in a vehicle. ?Do not drive: ?If you have been drinking alcohol. Do not ride with someone who has been drinking. ?When you are tired or distracted. ?While texting. ?If you have been using any mind-altering substances or drugs. ?Wear a helmet and other protective equipment during sports activities. ?If you have firearms in your house, make sure you follow all gun safety procedures. ?What's next? ?Go to your health care provider once a year for an annual wellness visit. ?Ask your health care provider how often you should have your eyes and teeth checked. ?Stay up to date on all vaccines. ?This information is not intended to replace advice given to you by your health care provider. Make sure you discuss any questions you have with your health care provider. ?Document Revised: 08/13/2020 Document Reviewed: 08/13/2020 ?Elsevier Patient Education ? [place]. ? ?

## 2021-07-09 NOTE — Assessment & Plan Note (Signed)
Follows with Urology.  ? ?Continue Cialis 5 mg daily. ?

## 2021-07-09 NOTE — Assessment & Plan Note (Signed)
Controlled. ? ?Continue injectable testosterone 0.6 mg weekly. ?Continue every 8 week blood donation.  ?

## 2021-07-09 NOTE — Assessment & Plan Note (Signed)
Overall controlled. ? ?Continue Nexium 20 mg daily. ?Discussed triggers.  ?

## 2021-07-09 NOTE — Assessment & Plan Note (Signed)
Not on treatment. ? ?Repeat lipid panel pending. ?

## 2021-07-09 NOTE — Assessment & Plan Note (Signed)
Controlled. ? ?Continue sildenafil 20 mg, 2-5 tablets as needed.  ?

## 2021-07-09 NOTE — Assessment & Plan Note (Signed)
Immunizations UTD. ?PSA UTD per Urology. ?Colonoscopy due and is scheduled for June 2023. ? ?Discussed the importance of a healthy diet and regular exercise in order for weight loss, and to reduce the risk of further co-morbidity. ? ?Exam today stable, labs pending ?

## 2021-07-09 NOTE — Progress Notes (Signed)
? ?Subjective:  ? ? Patient ID: Gregory Good, male    DOB: 09/18/1955, 66 y.o.   MRN: [id number] ? ?HPI ? ?Gregory Good is a very pleasant 66 y.o. male who presents today for complete physical and follow up of chronic conditions. ? ?Immunizations: ?-Tetanus: 2019 ?-Influenza: Did not complete last season  ?-Covid-19: 2 vaccines ?-Shingles: Completed Shingrix ?-Pneumonia: 2016 ? ?Diet: Fair diet.  ?Exercise: No regular exercise. ? ?Eye exam: Completes annually  ?Dental exam: Completes semi-annually  ? ?Colonoscopy: Completed nearly 10 years ago, referral placed to GI in 2022 and again in March 2023, scheduled for June 2023. ?PSA: Due ? ?Lung Cancer Screening: Completed in 2021, agrees to complete again.  ? ? ? ? ? ?Review of Systems  ?Constitutional:  Negative for unexpected weight change.  ?HENT:  Negative for rhinorrhea.   ?Respiratory:  Negative for cough and shortness of breath.   ?Cardiovascular:  Negative for chest pain.  ?Gastrointestinal:  Negative for constipation and diarrhea.  ?Genitourinary:  Negative for difficulty urinating.  ?Musculoskeletal:  Negative for arthralgias and myalgias.  ?Skin:  Negative for rash.  ?Allergic/Immunologic: Positive for environmental allergies.  ?Neurological:  Negative for dizziness and headaches.  ?Psychiatric/Behavioral:  The patient is not nervous/anxious.   ? ?   ? ? ?Past Medical History:  ?Diagnosis Date  ? Barrett's esophagus   ? Bladder cancer ([place]) 2016  ? Follows with Urology  ? Bladder cancer ([place])   ? Chronic back pain   ? GERD (gastroesophageal reflux disease)   ? Hyperlipidemia   ? IBS (irritable bowel syndrome)   ? ? ?Social History  ? ?Socioeconomic History  ? Marital status: Married  ?  Spouse name: Not on file  ? Number of children: Not on file  ? Years of education: Not on file  ? Highest education level: Not on file  ?Occupational History  ? Not on file  ?Tobacco Use  ? Smoking status: Every Day  ?  Packs/day: 1.00  ?  Years: 45.00  ?  Pack years:  45.00  ?  Types: Cigarettes  ? Smokeless tobacco: Never  ?Substance and Sexual Activity  ? Alcohol use: Yes  ?  Alcohol/week: 0.0 standard drinks  ?  Comment: very rarely  ? Drug use: No  ? Sexual activity: Yes  ?Other Topics Concern  ? Not on file  ?Social History Narrative  ? Married.   ? Works as Materials engineer.  ? Current smoker of 1-2 cigarettes per day.  ? Enjoys spending time with his family.  ? ?Social Determinants of Health  ? ?Financial Resource Strain: Not on file  ?Food Insecurity: Not on file  ?Transportation Needs: Not on file  ?Physical Activity: Not on file  ?Stress: Not on file  ?Social Connections: Not on file  ?Intimate Partner Violence: Not on file  ? ? ?Past Surgical History:  ?Procedure Laterality Date  ? CYSTOSCOPY N/A 01/28/2015  ? Procedure: CYSTOSCOPY;  Surgeon: Irine Seal, MD;  Location: [facility];  Service: Urology;  Laterality: N/A;  ? left hip surgery    ? 30 years ago for bone spur   ? right knee arthroscopy   2004  ? TONSILLECTOMY AND ADENOIDECTOMY    ? TRANSURETHRAL RESECTION OF BLADDER TUMOR N/A 01/28/2015  ? Procedure: TRANSURETHRAL RESECTION OF BLADDER TUMOR (TURBT) MITOMYCIN-C INSTILLATION;  Surgeon: Irine Seal, MD;  Location: [facility];  Service: Urology;  Laterality: N/A;  ? ? ?Family History  ?Problem Relation Age of Onset  ? Kidney cancer  Father   ? ? ?Allergies  ?Allergen Reactions  ? Zetia [Ezetimibe] Other (See Comments)  ?  Joint pain   ? ? ?Current Outpatient Medications on File Prior to Visit  ?Medication Sig Dispense Refill  ? esomeprazole (NEXIUM) 20 MG capsule TAKE 1 CAPSULE BY MOUTH TWICE DAILY BEFORE A MEAL 180 capsule 1  ? fexofenadine (ALLEGRA) 30 MG tablet Take 30 mg by mouth daily as needed (for allergies).     ? selenium sulfide (SELSUN) 2.5 % shampoo Apply topically.    ? sildenafil (REVATIO) 20 MG tablet TAKE 2 TO 5 TABLETS BY MOUTH 60 MINUTES PRIOR TO INTERCOURSE 90 tablet 2  ? testosterone cypionate (DEPOTESTOSTERONE CYPIONATE) 200 MG/ML injection Inject 0.5 ml into  the muscle once weekly. 3 mL 0  ? ?No current facility-administered medications on file prior to visit.  ? ? ?BP 120/76   Pulse 81   Temp 98.6 ?F (37 ?C) (Oral)   Ht '5\' 9"'$  (1.753 m)   Wt 192 lb (87.1 kg)   SpO2 97%   BMI 28.35 kg/m?  ?Objective:  ? Physical Exam ?HENT:  ?   Right Ear: Tympanic membrane and ear canal normal.  ?   Left Ear: Tympanic membrane and ear canal normal.  ?   Nose: Nose normal.  ?   Right Sinus: No maxillary sinus tenderness or frontal sinus tenderness.  ?   Left Sinus: No maxillary sinus tenderness or frontal sinus tenderness.  ?Eyes:  ?   Conjunctiva/sclera: Conjunctivae normal.  ?Neck:  ?   Thyroid: No thyromegaly.  ?   Vascular: No carotid bruit.  ?Cardiovascular:  ?   Rate and Rhythm: Normal rate and regular rhythm.  ?   Heart sounds: Normal heart sounds.  ?Pulmonary:  ?   Effort: Pulmonary effort is normal.  ?   Breath sounds: Normal breath sounds. No wheezing or rales.  ?Abdominal:  ?   General: Bowel sounds are normal.  ?   Palpations: Abdomen is soft.  ?   Tenderness: There is no abdominal tenderness.  ?Musculoskeletal:     ?   General: Normal range of motion.  ?   Cervical back: Neck supple.  ?Skin: ?   General: Skin is warm and dry.  ?Neurological:  ?   Mental Status: He is alert and oriented to person, place, and time.  ?   Cranial Nerves: No cranial nerve deficit.  ?   Deep Tendon Reflexes: Reflexes are normal and symmetric.  ?Psychiatric:     ?   Mood and Affect: Mood normal.  ? ? ? ? ? ?   ?Assessment & Plan:  ? ? ? ? ?This visit occurred during the SARS-CoV-2 public health emergency.  Safety protocols were in place, including screening questions prior to the visit, additional usage of staff PPE, and extensive cleaning of exam room while observing appropriate contact time as indicated for disinfecting solutions.  ?

## 2021-07-10 LAB — COMPREHENSIVE METABOLIC PANEL
ALT: 15 U/L (ref 0–53)
AST: 14 U/L (ref 0–37)
Albumin: 4.5 g/dL (ref 3.5–5.2)
Alkaline Phosphatase: 47 U/L (ref 39–117)
BUN: 12 mg/dL (ref 6–23)
CO2: 28 mEq/L (ref 19–32)
Calcium: 9.4 mg/dL (ref 8.4–10.5)
Chloride: 102 mEq/L (ref 96–112)
Creatinine, Ser: 1.32 mg/dL (ref 0.40–1.50)
GFR: 56.98 mL/min — ABNORMAL LOW (ref >60.00)
Glucose, Bld: 72 mg/dL (ref 70–99)
Potassium: 4.1 mEq/L (ref 3.5–5.1)
Sodium: 137 mEq/L (ref 135–145)
Total Bilirubin: 0.4 mg/dL (ref 0.2–1.2)
Total Protein: 7.1 g/dL (ref 6.0–8.3)

## 2021-07-10 LAB — LIPID PANEL
Cholesterol: 194 mg/dL (ref 0–200)
HDL: 26 mg/dL — ABNORMAL LOW (ref >39.00)
Total CHOL/HDL Ratio: 7
Triglycerides: 539 mg/dL — ABNORMAL HIGH (ref 0.0–149.0)

## 2021-07-10 LAB — LDL CHOLESTEROL, DIRECT: Direct LDL: 138 mg/dL

## 2021-07-21 DIAGNOSIS — E785 Hyperlipidemia, unspecified: Secondary | ICD-10-CM

## 2021-07-31 ENCOUNTER — Other Ambulatory Visit (INDEPENDENT_AMBULATORY_CARE_PROVIDER_SITE_OTHER): Payer: 59

## 2021-07-31 DIAGNOSIS — E785 Hyperlipidemia, unspecified: Secondary | ICD-10-CM | POA: Diagnosis not present

## 2021-07-31 LAB — LIPID PANEL
Cholesterol: 197 mg/dL (ref 0–200)
HDL: 26.7 mg/dL — ABNORMAL LOW (ref >39.00)
NonHDL: 170.52
Total CHOL/HDL Ratio: 7
Triglycerides: 219 mg/dL — ABNORMAL HIGH (ref 0.0–149.0)
VLDL: 43.8 mg/dL — ABNORMAL HIGH (ref 0.0–40.0)

## 2021-07-31 LAB — LDL CHOLESTEROL, DIRECT: Direct LDL: 142 mg/dL

## 2021-08-24 ENCOUNTER — Other Ambulatory Visit: Payer: Self-pay

## 2021-08-24 ENCOUNTER — Emergency Department (HOSPITAL_COMMUNITY): Payer: 59

## 2021-08-24 ENCOUNTER — Emergency Department (HOSPITAL_COMMUNITY)
Admission: EM | Admit: 2021-08-24 | Discharge: 2021-08-24 | Disposition: A | Discharge status: Home / Self Care | Payer: 59 | Attending: Emergency Medicine | Admitting: Emergency Medicine

## 2021-08-24 DIAGNOSIS — M5442 Lumbago with sciatica, left side: Secondary | ICD-10-CM | POA: Diagnosis not present

## 2021-08-24 DIAGNOSIS — M5441 Lumbago with sciatica, right side: Secondary | ICD-10-CM | POA: Insufficient documentation

## 2021-08-24 DIAGNOSIS — M545 Low back pain, unspecified: Secondary | ICD-10-CM | POA: Diagnosis present

## 2021-08-24 LAB — CBC WITH DIFFERENTIAL/PLATELET
Abs Immature Granulocytes: 0.02 10*3/uL (ref 0.00–0.07)
Basophils Absolute: 0 10*3/uL (ref 0.0–0.1)
Basophils Relative: 1 %
Eosinophils Absolute: 0.2 10*3/uL (ref 0.0–0.5)
Eosinophils Relative: 2 %
HCT: 43.8 % (ref 39.0–52.0)
Hemoglobin: 13.7 g/dL (ref 13.0–17.0)
Immature Granulocytes: 0 %
Lymphocytes Relative: 16 %
Lymphs Abs: 1.2 10*3/uL (ref 0.7–4.0)
MCH: 24.9 pg — ABNORMAL LOW (ref 26.0–34.0)
MCHC: 31.3 g/dL (ref 30.0–36.0)
MCV: 79.6 fL — ABNORMAL LOW (ref 80.0–100.0)
Monocytes Absolute: 0.5 10*3/uL (ref 0.1–1.0)
Monocytes Relative: 6 %
Neutro Abs: 5.6 10*3/uL (ref 1.7–7.7)
Neutrophils Relative %: 75 %
Platelets: 262 10*3/uL (ref 150–400)
RBC: 5.5 MIL/uL (ref 4.22–5.81)
RDW: 16.2 % — ABNORMAL HIGH (ref 11.5–15.5)
WBC: 7.5 10*3/uL (ref 4.0–10.5)
nRBC: 0 % (ref 0.0–0.2)

## 2021-08-24 LAB — COMPREHENSIVE METABOLIC PANEL
ALT: 20 U/L (ref 0–44)
AST: 20 U/L (ref 15–41)
Albumin: 4.6 g/dL (ref 3.5–5.0)
Alkaline Phosphatase: 45 U/L (ref 38–126)
Anion gap: 10 (ref 5–15)
BUN: 14 mg/dL (ref 8–23)
CO2: 24 mmol/L (ref 22–32)
Calcium: 9.4 mg/dL (ref 8.9–10.3)
Chloride: 103 mmol/L (ref 98–111)
Creatinine, Ser: 1.44 mg/dL — ABNORMAL HIGH (ref 0.61–1.24)
GFR, Estimated: 54 mL/min — ABNORMAL LOW (ref >60)
Glucose, Bld: 95 mg/dL (ref 70–99)
Potassium: 3.9 mmol/L (ref 3.5–5.1)
Sodium: 137 mmol/L (ref 135–145)
Total Bilirubin: 1 mg/dL (ref 0.3–1.2)
Total Protein: 7.7 g/dL (ref 6.5–8.1)

## 2021-08-24 MED ORDER — SODIUM CHLORIDE 0.9 % IV BOLUS
1000.0000 mL | Freq: Once | INTRAVENOUS | Status: AC
  Administered 2021-08-24: 1000 mL via INTRAVENOUS

## 2021-08-24 MED ORDER — HYDROMORPHONE HCL 1 MG/ML IJ SOLN: 1.0000 mg | Freq: Once | INTRAMUSCULAR | Status: DC

## 2021-08-24 MED ORDER — OXYCODONE-ACETAMINOPHEN 5-325 MG PO TABS: 1.0000 | ORAL_TABLET | Freq: Four times a day (QID) | ORAL | 0 refills | Status: DC | PRN

## 2021-08-24 MED ORDER — METOCLOPRAMIDE HCL 5 MG/ML IJ SOLN
10.0000 mg | Freq: Once | INTRAMUSCULAR | Status: AC
  Administered 2021-08-24: 10 mg via INTRAVENOUS
  Filled 2021-08-24: qty 2

## 2021-08-24 MED ORDER — OXYCODONE-ACETAMINOPHEN 5-325 MG PO TABS
1.0000 | ORAL_TABLET | Freq: Once | ORAL | Status: AC
  Administered 2021-08-24: 1 via ORAL
  Filled 2021-08-24: qty 1

## 2021-08-24 MED ORDER — DEXAMETHASONE SODIUM PHOSPHATE 10 MG/ML IJ SOLN
10.0000 mg | Freq: Once | INTRAMUSCULAR | Status: AC
  Administered 2021-08-24: 10 mg via INTRAVENOUS
  Filled 2021-08-24: qty 1

## 2021-08-24 MED ORDER — HYDROMORPHONE HCL 1 MG/ML IJ SOLN
0.5000 mg | Freq: Once | INTRAMUSCULAR | Status: AC
  Administered 2021-08-24: 0.5 mg via INTRAVENOUS
  Filled 2021-08-24: qty 1

## 2021-08-24 MED ORDER — METHYLPREDNISOLONE 4 MG PO TBPK: ORAL_TABLET | ORAL | 0 refills | Status: DC

## 2021-08-24 MED ORDER — ONDANSETRON HCL 4 MG/2ML IJ SOLN
4.0000 mg | Freq: Once | INTRAMUSCULAR | Status: AC
Start: 2021-08-24 — End: 2021-08-24
  Administered 2021-08-24: 4 mg via INTRAVENOUS
  Filled 2021-08-24: qty 2

## 2021-08-24 MED ORDER — TIZANIDINE HCL 4 MG PO TABS
2.0000 mg | ORAL_TABLET | Freq: Once | ORAL | Status: AC
Start: 2021-08-24 — End: 2021-08-24
  Administered 2021-08-24: 2 mg via ORAL
  Filled 2021-08-24: qty 1

## 2021-08-24 MED ORDER — PROMETHAZINE HCL 25 MG PO TABS: 25.0000 mg | ORAL_TABLET | Freq: Three times a day (TID) | ORAL | 0 refills | Status: DC | PRN

## 2021-08-24 MED ORDER — METHYL SALICYLATE-LIDO-MENTHOL 4-4-5 % EX PTCH: 1.0000 | MEDICATED_PATCH | Freq: Two times a day (BID) | CUTANEOUS | 0 refills | Status: DC

## 2021-08-27 ENCOUNTER — Telehealth: Payer: Self-pay | Admitting: Acute Care

## 2021-08-27 NOTE — Telephone Encounter (Signed)
Left VM and call back info for patient to call to schedule annual LDCt

## 2021-09-15 ENCOUNTER — Encounter: Payer: Self-pay | Admitting: Gastroenterology

## 2021-09-15 ENCOUNTER — Ambulatory Visit: Payer: 59 | Admitting: Gastroenterology

## 2021-09-15 VITALS — BP 125/85 | HR 80 | Temp 98.4°F | Ht 68.5 in | Wt 180.0 lb

## 2021-09-15 DIAGNOSIS — K2270 Barrett's esophagus without dysplasia: Secondary | ICD-10-CM

## 2021-09-15 DIAGNOSIS — Z1211 Encounter for screening for malignant neoplasm of colon: Secondary | ICD-10-CM | POA: Diagnosis not present

## 2021-09-15 MED ORDER — CLENPIQ 10-3.5-12 MG-GM -GM/160ML PO SOLN: 1.0000 | Freq: Once | ORAL | 0 refills | Status: AC

## 2021-09-15 NOTE — Progress Notes (Signed)
Cephas Darby, MD [address]  [place]  [place], [place] [address]  Main: [phone]  Fax: [phone]    Gastroenterology Consultation  Referring Provider:     Pleas Koch, NP Primary Care Physician:  Pleas Koch, NP Primary Gastroenterologist:  Dr. Cephas Darby Reason for Consultation: Screening colonoscopy and history of Barrett's esophagus        HPI:   Gregory Good is a 66 y.o. male referred by Dr. Carlis Abbott, Leticia Penna, NP  for consultation & management of colon cancer screening and history of Barrett's esophagus.  Patient reports that he had a colonoscopy 10 years ago, reportedly normal.  He is also diagnosed with Barrett's esophagus for 15 years ago.  He does have intermittent episodes of severe heartburn and about every 1 to 2 months, wake him up at night associated with severe spasm.  He has been taking Nexium 20 mg once a day and he takes famotidine as needed in the evening depending on what he eats.  He does not have any other concerns today.  He does have microcytosis without anemia although his hemoglobin has decreased from 17.5 to 13.7 within last 1 year.  Normal LFTs.  History of tobacco use, 45 pack years Patient denies any family history of GI malignancy  NSAIDs: None  Antiplts/Anticoagulants/Anti thrombotics: None  GI Procedures: None  Past Medical History:  Diagnosis Date   Barrett's esophagus    Bladder cancer ([place]) 2016   Follows with Urology   Bladder cancer [facility])    Chronic back pain    GERD (gastroesophageal reflux disease)    Hyperlipidemia    IBS (irritable bowel syndrome)     Past Surgical History:  Procedure Laterality Date   CYSTOSCOPY N/A 01/28/2015   Procedure: CYSTOSCOPY;  Surgeon: Irine Seal, MD;  [facility]: [facility];  Service: Urology;  Laterality: N/A;   left hip surgery     30 years ago for bone spur    right knee arthroscopy   2004   TONSILLECTOMY AND ADENOIDECTOMY     TRANSURETHRAL RESECTION OF BLADDER  TUMOR N/A 01/28/2015   Procedure: TRANSURETHRAL RESECTION OF BLADDER TUMOR (TURBT) MITOMYCIN-C INSTILLATION;  Surgeon: Irine Seal, MD;  Location[facility];  Service: Urology;  Laterality: N/A;     Current Outpatient Medications:    esomeprazole (NEXIUM) 20 MG capsule, TAKE 1 CAPSULE BY MOUTH TWICE DAILY BEFORE A MEAL, Disp: 180 capsule, Rfl: 1   fexofenadine (ALLEGRA) 30 MG tablet, Take 30 mg by mouth daily as needed (for allergies). , Disp: , Rfl:    tadalafil (CIALIS) 5 MG tablet, Take 5 mg by mouth daily., Disp: , Rfl:    testosterone cypionate (DEPOTESTOSTERONE CYPIONATE) 200 MG/ML injection, Inject 0.5 ml into the muscle once weekly., Disp: 3 mL, Rfl: 0   Family History  Problem Relation Age of Onset   Kidney cancer Father      Social History   Tobacco Use   Smoking status: Every Day    Packs/day: 1.00    Years: 45.00    Total pack years: 45.00    Types: Cigarettes   Smokeless tobacco: Never  Substance Use Topics   Alcohol use: Yes    Alcohol/week: 0.0 standard drinks of alcohol    Comment: very rarely   Drug use: No    Allergies as of 09/15/2021 - Review Complete 09/15/2021  Allergen Reaction Noted   Zetia [ezetimibe] Other (See Comments) 06/18/2020    Review of Systems:  All systems reviewed and negative except where noted in HPI.   Physical Exam:  BP 125/85   Pulse 80   Temp 98.4 F (36.9 C) (Oral)   Ht 5' 8.5" (1.74 m)   Wt 180 lb (81.6 kg)   BMI 26.97 kg/m  No LMP for male patient.  General:   Alert,  Well-developed, well-nourished, pleasant and cooperative in NAD Head:  Normocephalic and atraumatic. Eyes:  Sclera clear, no icterus.   Conjunctiva pink. Ears:  Normal auditory acuity. Nose:  No deformity, discharge, or lesions. Mouth:  No deformity or lesions,oropharynx pink & moist. Neck:  Supple; no masses or thyromegaly. Lungs:  Respirations even and unlabored.  Clear throughout to auscultation.   No wheezes, crackles, or rhonchi. No acute  distress. Heart:  Regular rate and rhythm; no murmurs, clicks, rubs, or gallops. Abdomen:  Normal bowel sounds. Soft, non-tender and non-distended without masses, hepatosplenomegaly or hernias noted.  No guarding or rebound tenderness.   Rectal: Not performed Msk:  Symmetrical without gross deformities. Good, equal movement & strength bilaterally. Pulses:  Normal pulses noted. Extremities:  No clubbing or edema.  No cyanosis. Neurologic:  Alert and oriented x3;  grossly normal neurologically. Skin:  Intact without significant lesions or rashes. No jaundice. Psych:  Alert and cooperative. Normal mood and affect.  Imaging Studies: None  Assessment and Plan:   Gregory Good is a 66 y.o. male with history of bladder cancer, currently in remission, history of chronic tobacco use, history of Barrett's esophagus, chronic GERD on long-term low-dose PPI is seen in consultation to discuss about endoscopy and colonoscopy  Schedule upper endoscopy for surveillance of Barrett's esophagus, recommend gastric and esophageal biopsies Continue Nexium 20 mg daily before breakfast Schedule colonoscopy for colon cancer screening  I have discussed alternative options, risks & benefits,  which include, but are not limited to, bleeding, infection, perforation,respiratory complication & drug reaction.  The patient agrees with this plan & written consent will be obtained.     Follow up based on the above work-up   Cephas Darby, MD

## 2021-09-15 NOTE — Addendum Note (Signed)
Addended by: Lurlean Nanny on: 09/15/2021 03:29 PM   Modules accepted: Orders

## 2021-09-15 NOTE — Addendum Note (Signed)
Addended by: Lurlean Nanny on: 09/15/2021 03:55 PM   Modules accepted: Orders

## 2021-09-28 ENCOUNTER — Encounter
Admission: RE | Disposition: A | Discharge status: Home / Self Care | Payer: Self-pay | Source: Home / Self Care | Attending: Gastroenterology

## 2021-09-28 ENCOUNTER — Ambulatory Visit: Payer: 59 | Admitting: Anesthesiology

## 2021-09-28 ENCOUNTER — Ambulatory Visit
Admission: RE | Admit: 2021-09-28 | Discharge: 2021-09-28 | Disposition: A | Discharge status: Home / Self Care | Payer: 59 | Attending: Gastroenterology | Admitting: Gastroenterology

## 2021-09-28 ENCOUNTER — Encounter: Payer: Self-pay | Admitting: Gastroenterology

## 2021-09-28 DIAGNOSIS — Z79899 Other long term (current) drug therapy: Secondary | ICD-10-CM | POA: Diagnosis not present

## 2021-09-28 DIAGNOSIS — M549 Dorsalgia, unspecified: Secondary | ICD-10-CM | POA: Insufficient documentation

## 2021-09-28 DIAGNOSIS — K219 Gastro-esophageal reflux disease without esophagitis: Secondary | ICD-10-CM | POA: Diagnosis not present

## 2021-09-28 DIAGNOSIS — D126 Benign neoplasm of colon, unspecified: Secondary | ICD-10-CM | POA: Insufficient documentation

## 2021-09-28 DIAGNOSIS — F17210 Nicotine dependence, cigarettes, uncomplicated: Secondary | ICD-10-CM | POA: Diagnosis not present

## 2021-09-28 DIAGNOSIS — G8929 Other chronic pain: Secondary | ICD-10-CM | POA: Diagnosis not present

## 2021-09-28 DIAGNOSIS — K635 Polyp of colon: Secondary | ICD-10-CM

## 2021-09-28 DIAGNOSIS — Z8551 Personal history of malignant neoplasm of bladder: Secondary | ICD-10-CM | POA: Insufficient documentation

## 2021-09-28 DIAGNOSIS — Z1211 Encounter for screening for malignant neoplasm of colon: Secondary | ICD-10-CM | POA: Diagnosis not present

## 2021-09-28 DIAGNOSIS — K449 Diaphragmatic hernia without obstruction or gangrene: Secondary | ICD-10-CM | POA: Insufficient documentation

## 2021-09-28 DIAGNOSIS — K589 Irritable bowel syndrome without diarrhea: Secondary | ICD-10-CM | POA: Diagnosis not present

## 2021-09-28 DIAGNOSIS — K2270 Barrett's esophagus without dysplasia: Secondary | ICD-10-CM | POA: Insufficient documentation

## 2021-09-28 DIAGNOSIS — D127 Benign neoplasm of rectosigmoid junction: Secondary | ICD-10-CM | POA: Insufficient documentation

## 2021-09-28 HISTORY — PX: ESOPHAGOGASTRODUODENOSCOPY: SHX5428

## 2021-09-28 HISTORY — PX: COLONOSCOPY WITH PROPOFOL: SHX5780

## 2021-09-28 SURGERY — COLONOSCOPY WITH PROPOFOL
Anesthesia: General

## 2021-09-28 MED ORDER — PROPOFOL 10 MG/ML IV BOLUS
INTRAVENOUS | Status: DC | PRN
  Administered 2021-09-28: 80 mg via INTRAVENOUS

## 2021-09-28 MED ORDER — SODIUM CHLORIDE 0.9 % IV SOLN: INTRAVENOUS | Status: DC

## 2021-09-28 MED ORDER — PROPOFOL 500 MG/50ML IV EMUL
INTRAVENOUS | Status: DC | PRN
  Administered 2021-09-28: 150 ug/kg/min via INTRAVENOUS

## 2021-09-28 MED ORDER — LIDOCAINE HCL (CARDIAC) PF 100 MG/5ML IV SOSY
PREFILLED_SYRINGE | INTRAVENOUS | Status: DC | PRN
  Administered 2021-09-28: 50 mg via INTRAVENOUS

## 2021-09-28 MED ORDER — PROPOFOL 1000 MG/100ML IV EMUL
INTRAVENOUS | Status: AC
  Filled 2021-09-28: qty 100

## 2021-09-28 NOTE — Anesthesia Preprocedure Evaluation (Signed)
Anesthesia Evaluation  Patient identified by MRN, date of birth, ID band Patient awake    Reviewed: Allergy & Precautions, NPO status , Patient's Chart, lab work & pertinent test results  History of Anesthesia Complications Negative for: history of anesthetic complications  Airway Mallampati: III  TM Distance: <3 FB Neck ROM: full    Dental  (+) Chipped   Pulmonary Current Smoker,    Pulmonary exam normal        Cardiovascular (-) anginanegative cardio ROS Normal cardiovascular exam     Neuro/Psych  Neuromuscular disease negative psych ROS   GI/Hepatic Neg liver ROS, GERD  Controlled,  Endo/Other  negative endocrine ROS  Renal/GU negative Renal ROS  negative genitourinary   Musculoskeletal   Abdominal   Peds  Hematology negative hematology ROS (+)   Anesthesia Other Findings Past Medical History: No date: Barrett's esophagus 2016: Bladder cancer ([place])     Comment:  Follows with Urology No date: Bladder cancer ([place]) No date: Chronic back pain No date: GERD (gastroesophageal reflux disease) No date: Hyperlipidemia No date: IBS (irritable bowel syndrome)  Past Surgical History: 01/28/2015: CYSTOSCOPY; N/A     Comment:  Procedure: CYSTOSCOPY;  Surgeon: Irine Seal, MD;                Location: [facility];  Service: Urology;  Laterality: N/A; No date: left hip surgery     Comment:  30 years ago for bone spur  2004: right knee arthroscopy  No date: TONSILLECTOMY AND ADENOIDECTOMY 01/28/2015: TRANSURETHRAL RESECTION OF BLADDER TUMOR; N/A     Comment:  Procedure: TRANSURETHRAL RESECTION OF BLADDER TUMOR               (TURBT) MITOMYCIN-C INSTILLATION;  Surgeon: Irine Seal,               MD;  Location: [facility];  Service: Urology;  Laterality:               N/A;  BMI    Body Mass Index: 27.02 kg/m      Reproductive/Obstetrics negative OB ROS                             Anesthesia  Physical Anesthesia Plan  ASA: 3  Anesthesia Plan: General   Post-op Pain Management:    Induction: Intravenous  PONV Risk Score and Plan: Propofol infusion and TIVA  Airway Management Planned: Natural Airway and Nasal Cannula  Additional Equipment:   Intra-op Plan:   Post-operative Plan:   Informed Consent: I have reviewed the patients History and Physical, chart, labs and discussed the procedure including the risks, benefits and alternatives for the proposed anesthesia with the patient or authorized representative who has indicated his/her understanding and acceptance.     Dental Advisory Given  Plan Discussed with: Anesthesiologist, CRNA and Surgeon  Anesthesia Plan Comments: (Patient consented for risks of anesthesia including but not limited to:  - adverse reactions to medications - risk of airway placement if required - damage to eyes, teeth, lips or other oral mucosa - nerve damage due to positioning  - sore throat or hoarseness - Damage to heart, brain, nerves, lungs, other parts of body or loss of life  Patient voiced understanding.)        Anesthesia Quick Evaluation

## 2021-09-28 NOTE — Transfer of Care (Signed)
Immediate Anesthesia Transfer of Care Note  Patient: Gregory Good  Procedure(s) Performed: COLONOSCOPY WITH PROPOFOL ESOPHAGOGASTRODUODENOSCOPY (EGD)  Patient Location: PACU  Anesthesia Type:General  Level of Consciousness: awake and drowsy  Airway & Oxygen Therapy: Patient Spontanous Breathing  Post-op Assessment: Report given to RN and Post -op Vital signs reviewed and stable  Post vital signs: Reviewed and stable  Last Vitals:  Vitals Value Taken Time  BP 88/41 09/28/21 1126  Temp 36.2 C 09/28/21 1125  Pulse 80 09/28/21 1126  Resp 17 09/28/21 1126  SpO2 97 % 09/28/21 1126  Vitals shown include unvalidated device data.  Last Pain:  Vitals:   09/28/21 1125  TempSrc: Temporal  PainSc: Asleep         Complications: No notable events documented.

## 2021-09-28 NOTE — Anesthesia Procedure Notes (Signed)
Date/Time: 09/28/2021 10:52 AM  Performed by: Johnna Acosta, CRNAPre-anesthesia Checklist: Patient identified, Emergency Drugs available, Suction available, Patient being monitored and Timeout performed Patient Re-evaluated:Patient Re-evaluated prior to induction Oxygen Delivery Method: Nasal cannula Preoxygenation: Pre-oxygenation with 100% oxygen Induction Type: IV induction

## 2021-09-28 NOTE — Op Note (Signed)
[facility] Gastroenterology Patient Name: Gregory Good Procedure Date: 09/28/2021 10:40 AM MRN: [id number] Account #: [id number] Date of Birth: 1955-11-25 Admit Type: Outpatient Age: 66 Room: [facility] ENDO ROOM 4 Gender: Male Note Status: Finalized Instrument Name: Upper Endoscope [id number] Procedure:             Upper GI endoscopy Indications:           Follow-up of Barrett's esophagus Providers:             Lin Landsman MD, MD Referring MD:          Pleas Koch (Referring MD) Medicines:             General Anesthesia Complications:         No immediate complications. Estimated blood loss: None. Procedure:             Pre-Anesthesia Assessment:                        - Prior to the procedure, a History and Physical was                         performed, and patient medications and allergies were                         reviewed. The patient is competent. The risks and                         benefits of the procedure and the sedation options and                         risks were discussed with the patient. All questions                         were answered and informed consent was obtained.                         Patient identification and proposed procedure were                         verified by the physician, the nurse, the                         anesthesiologist, the anesthetist and the technician                         in the pre-procedure area in the procedure room in the                         endoscopy suite. Mental Status Examination: alert and                         oriented. Airway Examination: normal oropharyngeal                         airway and neck mobility. Respiratory Examination:                         clear to auscultation. CV Examination: normal.  Prophylactic Antibiotics: The patient does not require                         prophylactic antibiotics. Prior Anticoagulants: The                          patient has taken no previous anticoagulant or                         antiplatelet agents. ASA Grade Assessment: III - A                         patient with severe systemic disease. After reviewing                         the risks and benefits, the patient was deemed in                         satisfactory condition to undergo the procedure. The                         anesthesia plan was to use general anesthesia.                         Immediately prior to administration of medications,                         the patient was re-assessed for adequacy to receive                         sedatives. The heart rate, respiratory rate, oxygen                         saturations, blood pressure, adequacy of pulmonary                         ventilation, and response to care were monitored                         throughout the procedure. The physical status of the                         patient was re-assessed after the procedure.                        After obtaining informed consent, the endoscope was                         passed under direct vision. Throughout the procedure,                         the patient's blood pressure, pulse, and oxygen                         saturations were monitored continuously. The Endoscope                         was introduced through the mouth, and advanced to the  second part of duodenum. The upper GI endoscopy was                         accomplished without difficulty. The patient tolerated                         the procedure well. Findings:      The esophagus was normal.      The stomach was normal.      The examined duodenum was normal.      The cardia and gastric fundus were normal on retroflexion.      Esophagogastric landmarks were identified: the gastroesophageal junction       was found at 40 cm from the incisors.      A small hiatal hernia was present.      The Z-line was irregular and was found 40 cm from the  incisors. Impression:            - Normal gastroesophageal junction and esophagus.                        - Normal stomach.                        - Normal examined duodenum.                        - No specimens collected. Recommendation:        - Proceed with colonoscopy as scheduled                        See colonoscopy report Procedure Code(s):     --- Professional ---                        [phone], Esophagogastroduodenoscopy, flexible,                         transoral; diagnostic, including collection of                         specimen(s) by brushing or washing, when performed                         (separate procedure) Diagnosis Code(s):     --- Professional ---                        K22.70, Barrett's esophagus without dysplasia CPT copyright 2019 [employer]. All rights reserved. The codes documented in this report are preliminary and upon coder review may  be revised to meet current compliance requirements. Dr. Ulyess Mort Lin Landsman MD, MD 09/28/2021 11:06:22 AM This report has been signed electronically. Number of Addenda: 0 Note Initiated On: 09/28/2021 10:40 AM Estimated Blood Loss:  Estimated blood loss: none. Estimated blood loss:                         none. Estimated blood loss: none.      [facility]

## 2021-09-28 NOTE — H&P (Signed)
Gregory Darby, MD [address]  [place]  [place], [place] [address]  Main: [phone]  Fax: [phone] Pager: [phone]  Primary Care Physician:  Pleas Koch, NP Primary Gastroenterologist:  Dr. Cephas Good  Pre-Procedure History & Physical: HPI:  Gregory Good is a 66 y.o. male is here for an colonoscopy.   Past Medical History:  Diagnosis Date   Barrett's esophagus    Bladder cancer ([place]) 2016   Follows with Urology   Bladder cancer [facility])    Chronic back pain    GERD (gastroesophageal reflux disease)    Hyperlipidemia    IBS (irritable bowel syndrome)     Past Surgical History:  Procedure Laterality Date   CYSTOSCOPY N/A 01/28/2015   Procedure: CYSTOSCOPY;  Surgeon: Irine Seal, MD;  Location: [facility];  Service: Urology;  Laterality: N/A;   left hip surgery     30 years ago for bone spur    right knee arthroscopy   2004   TONSILLECTOMY AND ADENOIDECTOMY     TRANSURETHRAL RESECTION OF BLADDER TUMOR N/A 01/28/2015   Procedure: TRANSURETHRAL RESECTION OF BLADDER TUMOR (TURBT) MITOMYCIN-C INSTILLATION;  Surgeon: Irine Seal, MD;  Location: [facility];  Service: Urology;  Laterality: N/A;    Prior to Admission medications   Medication Sig Start Date End Date Taking? Authorizing Provider  esomeprazole (NEXIUM) 20 MG capsule TAKE 1 CAPSULE BY MOUTH TWICE DAILY BEFORE A MEAL 12/22/18  Yes Pleas Koch, NP  fexofenadine (ALLEGRA) 30 MG tablet Take 30 mg by mouth daily as needed (for allergies).    Yes [provider]  tadalafil (CIALIS) 5 MG tablet Take 5 mg by mouth daily.    [provider]  testosterone cypionate (DEPOTESTOSTERONE CYPIONATE) 200 MG/ML injection Inject 0.5 ml into the muscle once weekly. 05/11/19   Pleas Koch, NP    Allergies as of 09/16/2021 - Review Complete 09/15/2021  Allergen Reaction Noted   Zetia [ezetimibe] Other (See Comments) 06/18/2020    Family History  Problem Relation Age of Onset    Kidney cancer Father     Social History   Socioeconomic History   Marital status: Married    Spouse name: Not on file   Number of children: Not on file   Years of education: Not on file   Highest education level: Not on file  Occupational History   Not on file  Tobacco Use   Smoking status: Every Day    Packs/day: 1.00    Years: 45.00    Total pack years: 45.00    Types: Cigarettes   Smokeless tobacco: Never  Vaping Use   Vaping Use: Never used  Substance and Sexual Activity   Alcohol use: Yes    Alcohol/week: 0.0 standard drinks of alcohol    Comment: very rarely   Drug use: No   Sexual activity: Yes  Other Topics Concern   Not on file  Social History Narrative   Married.    Works as Materials engineer.   Current smoker of 1-2 cigarettes per day.   Enjoys spending time with his family.   Social Determinants of Health   Financial Resource Strain: Not on file  Food Insecurity: Not on file  Transportation Needs: Not on file  Physical Activity: Not on file  Stress: Not on file  Social Connections: Not on file  Intimate Partner Violence: Not on file    Review of Systems: See HPI, otherwise negative ROS  Physical Exam: BP 111/80  Pulse 78   Temp (!) 96.7 F (35.9 C) (Temporal)   Ht '5\' 9"'$  (1.753 m)   Wt 83 kg   SpO2 100%   BMI 27.02 kg/m  General:   Alert,  pleasant and cooperative in NAD Head:  Normocephalic and atraumatic. Neck:  Supple; no masses or thyromegaly. Lungs:  Clear throughout to auscultation.    Heart:  Regular rate and rhythm. Abdomen:  Soft, nontender and nondistended. Normal bowel sounds, without guarding, and without rebound.   Neurologic:  Alert and  oriented x4;  grossly normal neurologically.  Impression/Plan: Derrik Mceachern is here for an colonoscopy to be performed for Barrett's esophagus and colon cancer screening  Risks, benefits, limitations, and alternatives regarding  endoscopy and colonoscopy have been reviewed with the patient.   Questions have been answered.  All parties agreeable.   Sherri Sear, MD  09/28/2021, 10:36 AM

## 2021-09-28 NOTE — Op Note (Signed)
[facility]enter[facility] Patient Name: Gregory Good Procedure Date: 09/28/2021 10:40 AM MRN: [id number] Account #: [id number] Date of Birth: September 02, 1955 Admit Type: Outpatient Age: 66 Room: [facility] ENDO ROOM 4 Gender: Male Note Status: Finalized Instrument Name: Colonoscope [id number] Procedure:             Colonoscopy Indications:           Screening for colorectal malignant neoplasm Providers:             Lin Landsman MD, MD Referring MD:          Pleas Koch (Referring MD) Medicines:             General Anesthesia Complications:         No immediate complications. Estimated blood loss: None. Procedure:             Pre-Anesthesia Assessment:                        - Prior to the procedure, a History and Physical was                         performed, and patient medications and allergies were                         reviewed. The patient is competent. The risks and                         benefits of the procedure and the sedation options and                         risks were discussed with the patient. All questions                         were answered and informed consent was obtained.                         Patient identification and proposed procedure were                         verified by the physician, the nurse, the                         anesthesiologist, the anesthetist and the technician                         in the pre-procedure area in the procedure room in the                         endoscopy suite. Mental Status Examination: alert and                         oriented. Airway Examination: normal oropharyngeal                         airway and neck mobility. Respiratory Examination:                         clear to auscultation. CV Examination: normal.  Prophylactic Antibiotics: The patient does not require                         prophylactic antibiotics. Prior Anticoagulants: The                          patient has taken no previous anticoagulant or                         antiplatelet agents. ASA Grade Assessment: III - A                         patient with severe systemic disease. After reviewing                         the risks and benefits, the patient was deemed in                         satisfactory condition to undergo the procedure. The                         anesthesia plan was to use general anesthesia.                         Immediately prior to administration of medications,                         the patient was re-assessed for adequacy to receive                         sedatives. The heart rate, respiratory rate, oxygen                         saturations, blood pressure, adequacy of pulmonary                         ventilation, and response to care were monitored                         throughout the procedure. The physical status of the                         patient was re-assessed after the procedure.                        After obtaining informed consent, the colonoscope was                         passed under direct vision. Throughout the procedure,                         the patient's blood pressure, pulse, and oxygen                         saturations were monitored continuously. The                         Colonoscope was introduced through the anus and  advanced to the the cecum, identified by appendiceal                         orifice and ileocecal valve. The colonoscopy was                         performed without difficulty. The patient tolerated                         the procedure well. The quality of the bowel                         preparation was evaluated using the BBPS [place] Day Bowel                         Preparation Scale) with scores of: Right Colon = 2                         (minor amount of residual staining, small fragments of                         stool and/or opaque liquid, but mucosa seen well),                          Transverse Colon = 2 (minor amount of residual                         staining, small fragments of stool and/or opaque                         liquid, but mucosa seen well) and Left Colon = 3                         (entire mucosa seen well with no residual staining,                         small fragments of stool or opaque liquid). The total                         BBPS score equals 7. Findings:      The perianal and digital rectal examinations were normal. Pertinent       negatives include normal sphincter tone and no palpable rectal lesions.      Four sessile polyps were found in the recto-sigmoid colon and ileocecal       valve. The polyps were 3 to 7 mm in size. These polyps were removed with       a cold snare. Resection and retrieval were complete.      The retroflexed view of the distal rectum and anal verge was normal and       showed no anal or rectal abnormalities. Impression:            - Four 3 to 7 mm polyps at the recto-sigmoid colon and                         at the ileocecal valve, removed with a cold snare.  Resected and retrieved.                        - The distal rectum and anal verge are normal on                         retroflexion view. Recommendation:        - Discharge patient to home (with escort).                        - Resume previous diet today.                        - Continue present medications.                        - Await pathology results.                        - Repeat colonoscopy in 3 years for surveillance due                         to fair prep. Procedure Code(s):     --- Professional ---                        [id number], Colonoscopy, flexible; with removal of                         tumor(s), polyp(s), or other lesion(s) by snare                         technique Diagnosis Code(s):     --- Professional ---                        Z12.11, Encounter for screening for malignant neoplasm                          of colon                        K63.5, Polyp of colon CPT copyright 2019 American Medical Association. All rights reserved. The codes documented in this report are preliminary and upon coder review may  be revised to meet current compliance requirements. Dr. Ulyess Mort Lin Landsman MD, MD 09/28/2021 11:20:25 AM This report has been signed electronically. Number of Addenda: 0 Note Initiated On: 09/28/2021 10:40 AM Scope Withdrawal Time: 0 hours 7 minutes 55 seconds  Total Procedure Duration: 0 hours 10 minutes 4 seconds  Estimated Blood Loss:  Estimated blood loss: none.      [facility]

## 2021-09-28 NOTE — Anesthesia Postprocedure Evaluation (Signed)
Anesthesia Post Note  Patient: Gregory Good  Procedure(s) Performed: COLONOSCOPY WITH PROPOFOL ESOPHAGOGASTRODUODENOSCOPY (EGD)  Patient location during evaluation: Endoscopy Anesthesia Type: General Level of consciousness: awake and alert Pain management: pain level controlled Vital Signs Assessment: post-procedure vital signs reviewed and stable Respiratory status: spontaneous breathing, nonlabored ventilation, respiratory function stable and patient connected to nasal cannula oxygen Cardiovascular status: blood pressure returned to baseline and stable Postop Assessment: no apparent nausea or vomiting Anesthetic complications: no   No notable events documented.   Last Vitals:  Vitals:   09/28/21 1134 09/28/21 1144  BP: 102/76 106/64  Pulse: 78   Resp: 16   Temp:    SpO2: 100%     Last Pain:  Vitals:   09/28/21 1144  TempSrc:   PainSc: 0-No pain                 Precious Haws Jarita Raval

## 2021-09-29 ENCOUNTER — Encounter: Payer: Self-pay | Admitting: Gastroenterology

## 2021-09-29 LAB — SURGICAL PATHOLOGY

## 2021-10-07 ENCOUNTER — Other Ambulatory Visit: Payer: Self-pay | Admitting: Primary Care

## 2021-10-08 NOTE — Telephone Encounter (Signed)
Can we send refill request to his Urologist?

## 2021-11-16 IMAGING — CT CT CHEST LUNG CANCER SCREENING LOW DOSE W/O CM
2 of 5 series · 15 of 40 positions shown, 18 images · non-contrast
Comparison: None.

CLINICAL DATA: 63-year-old male with 45 pack year history of
smoking. Lung cancer screening.

EXAM:
CT CHEST WITHOUT CONTRAST LOW-DOSE FOR LUNG CANCER SCREENING
TECHNIQUE: Multidetector CT imaging of the chest was performed following the
standard protocol without IV contrast.

[Series 3: lung 1.00 · axial · 0.70mm/px · z∈[-1229,-907]mm · 12 of 356 slices shown, 15 images]
[im 17/356  mediastinal]
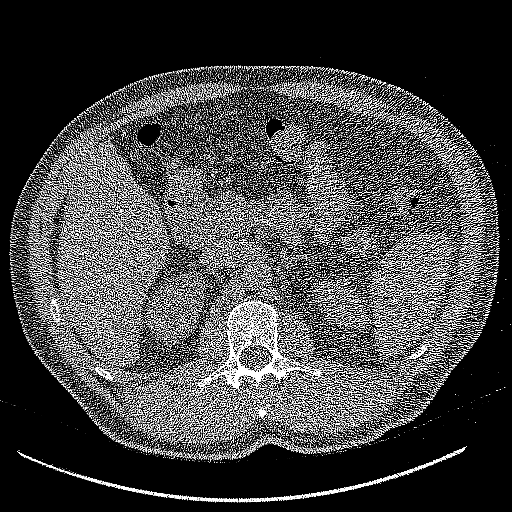
[im 17/356  lung]
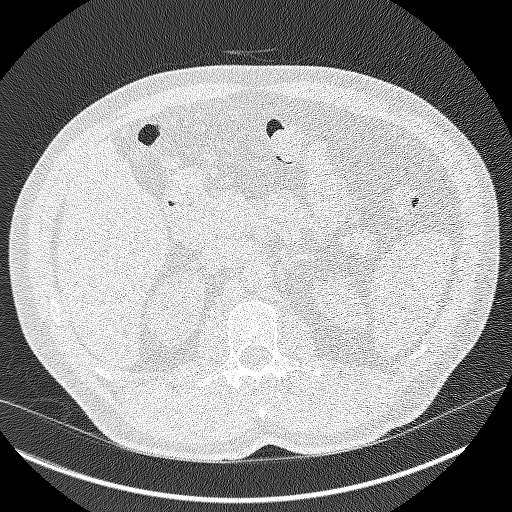
[im 49/356  lung]
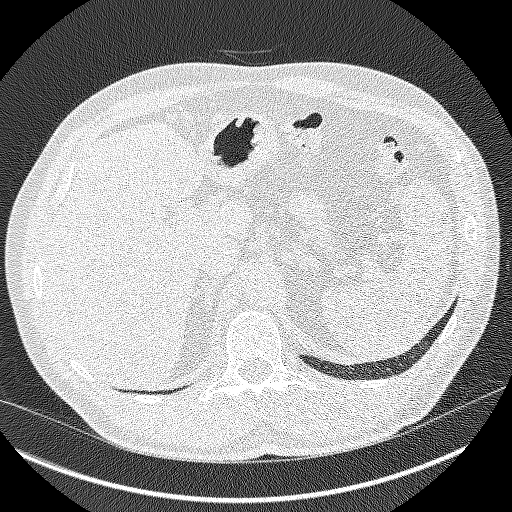
[im 81/356  lung]
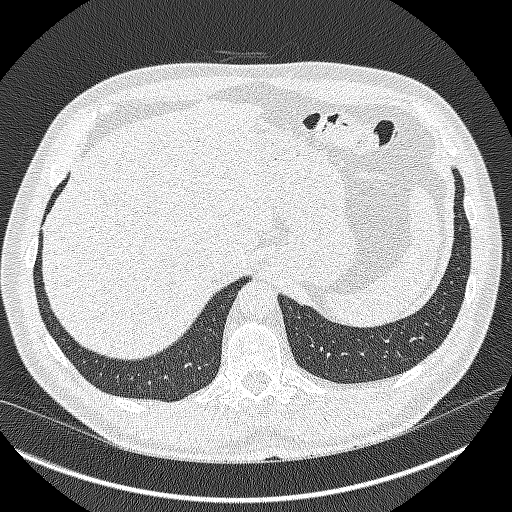
[im 113/356  lung]
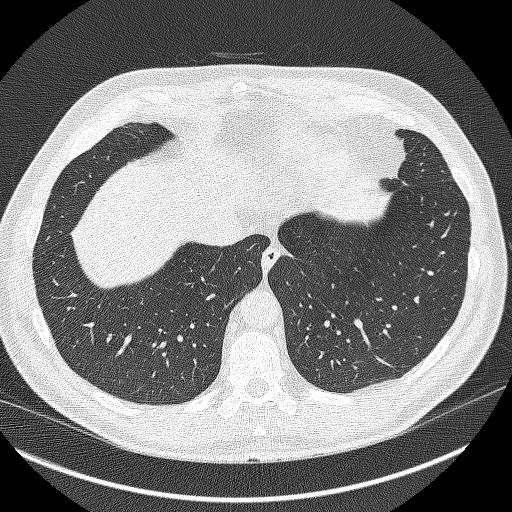
[im 130/356  mediastinal]
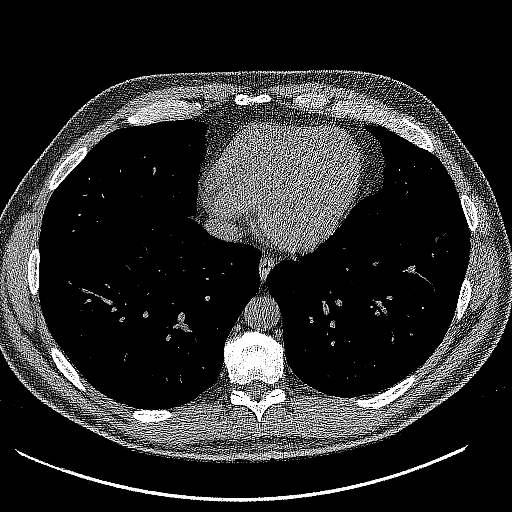
[im 130/356  lung]
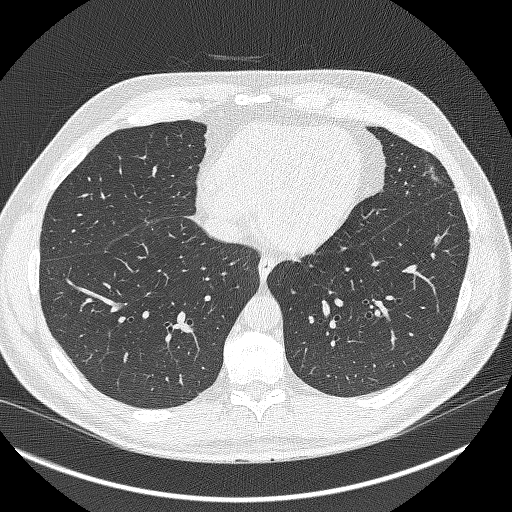
[im 162/356  lung]
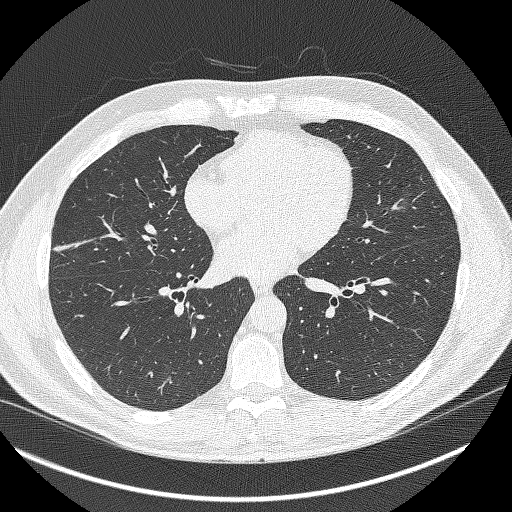
[im 194/356  lung]
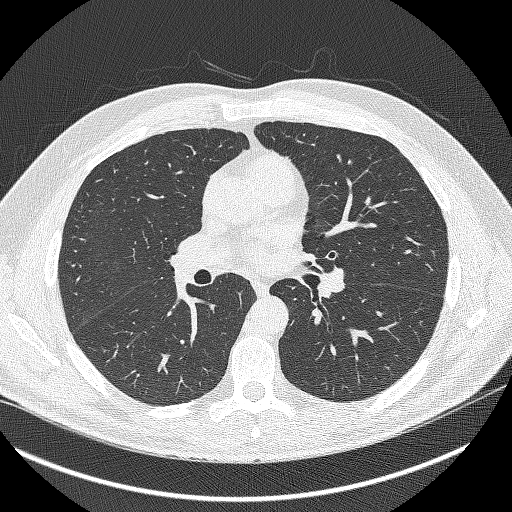
[im 226/356  lung]
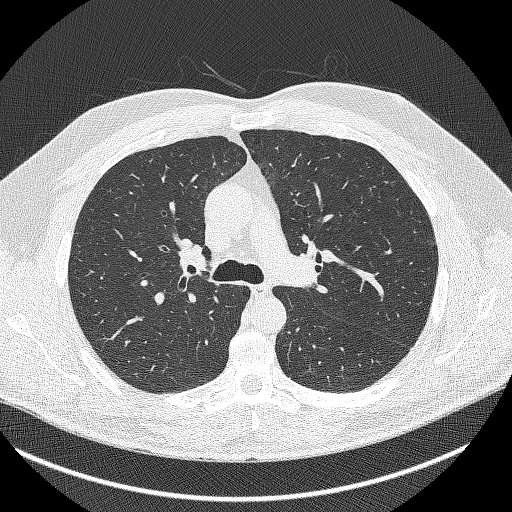
[im 243/356  mediastinal]
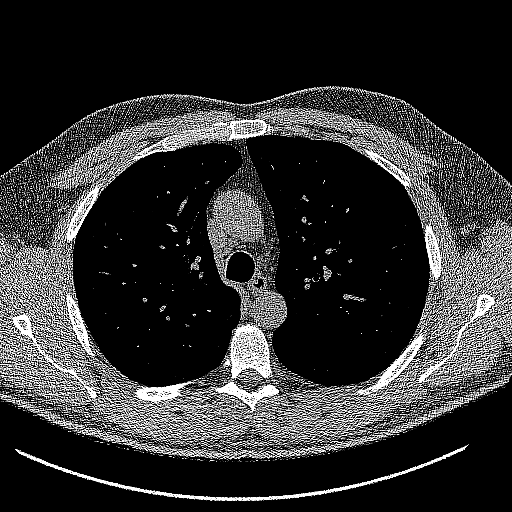
[im 243/356  lung]
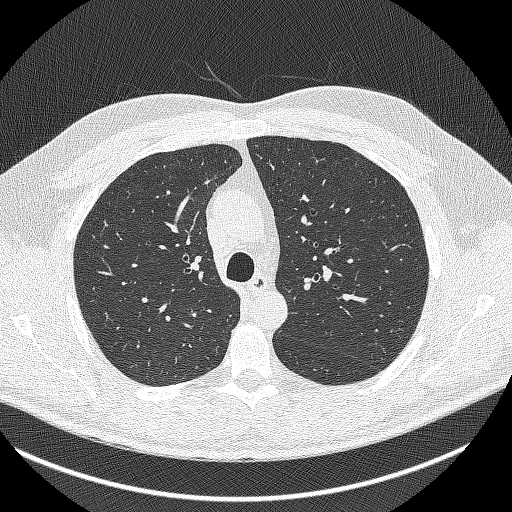
[im 275/356  lung]
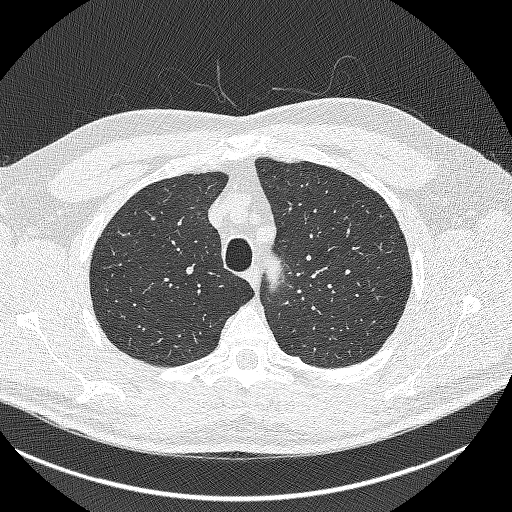
[im 307/356  lung]
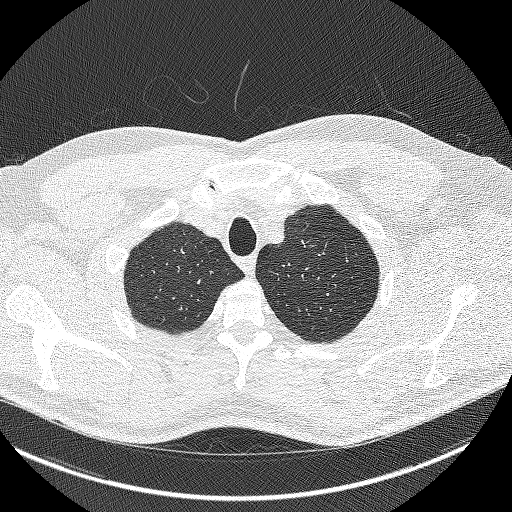
[im 339/356  lung]
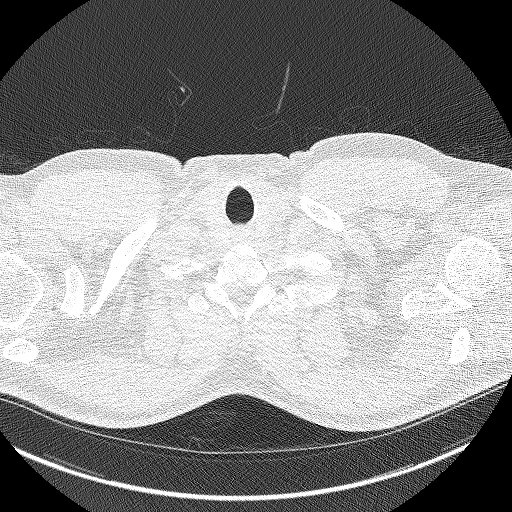

[Series 4: coronals lung 1.00 cor · coronal · 0.70mm/px · 3 of 360 slices shown]
[im 72/360  lung]
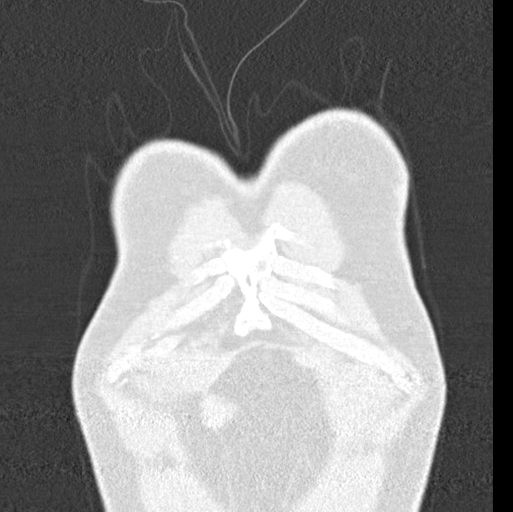
[im 144/360  lung]
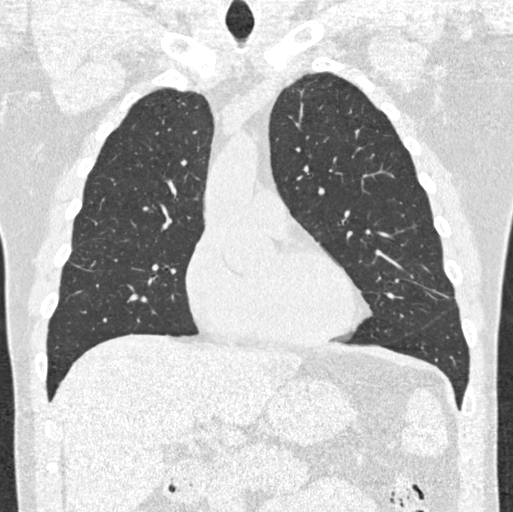
[im 216/360  lung]
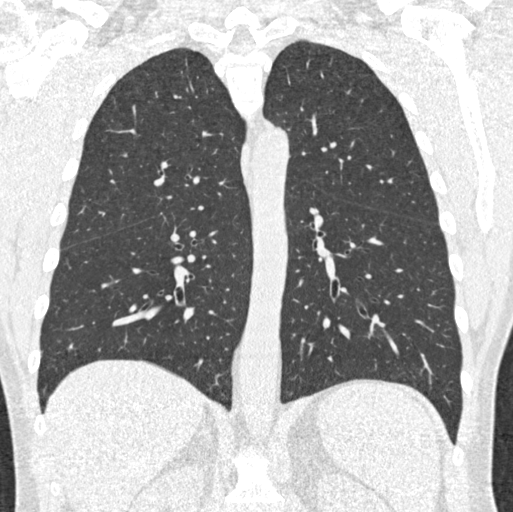

[15 of 40 positions shown; findings below may reference images not displayed]

FINDINGS: Cardiovascular: The heart size is normal. No substantial pericardial
effusion.

Mediastinum/Nodes: No mediastinal lymphadenopathy. No evidence for
gross hilar lymphadenopathy although assessment is limited by the
lack of intravenous contrast on today's study. The esophagus has
normal imaging features. There is no axillary lymphadenopathy.

Lungs/Pleura: Centrilobular emphsyema noted. 2.5 mm perifissural
nodule noted right mid lung. No suspicious nodule or mass on today's
study. No focal airspace consolidation. No pleural effusion.

Upper Abdomen: Multiple hypoattenuating lesions are identified in
the liver measuring up to 3.7 cm in the left hepatic dome. Largest
lesions measure near water attenuation are likely cysts. The smaller
lesions are too small to characterize but are also likely benign.
Overall, these lesions appear stable comparing back to abdomen CT
01/27/2015.

Musculoskeletal: No worrisome lytic or sclerotic osseous
abnormality.
IMPRESSION: 1. Lung-RADS 2, benign appearance or behavior. Continue annual
screening with low-dose chest CT without contrast in 12 months.
2.  Emphysema ([id number].B)

## 2021-12-22 ENCOUNTER — Other Ambulatory Visit: Payer: Self-pay | Admitting: Primary Care

## 2021-12-22 DIAGNOSIS — N529 Male erectile dysfunction, unspecified: Secondary | ICD-10-CM

## 2022-03-24 ENCOUNTER — Other Ambulatory Visit: Payer: Self-pay | Admitting: Primary Care

## 2022-03-24 DIAGNOSIS — N529 Male erectile dysfunction, unspecified: Secondary | ICD-10-CM

## 2022-04-15 ENCOUNTER — Telehealth (INDEPENDENT_AMBULATORY_CARE_PROVIDER_SITE_OTHER): Payer: 59 | Admitting: Primary Care

## 2022-04-15 ENCOUNTER — Encounter: Payer: Self-pay | Admitting: Primary Care

## 2022-04-15 VITALS — Ht 69.0 in | Wt 183.0 lb

## 2022-04-15 DIAGNOSIS — J069 Acute upper respiratory infection, unspecified: Secondary | ICD-10-CM

## 2022-04-15 MED ORDER — GUAIFENESIN-CODEINE 100-10 MG/5ML PO SYRP: 5.0000 mL | ORAL_SOLUTION | Freq: Three times a day (TID) | ORAL | 0 refills | Status: DC | PRN

## 2022-04-15 NOTE — Progress Notes (Signed)
Patient ID: Gregory Good, male    DOB: [birth year], 67 y.o.   MRN: [id number]  Virtual visit completed through [employer], a video enabled telemedicine application. Due to national recommendations of social distancing due to COVID-19, a virtual visit is felt to be most appropriate for this patient at this time. Reviewed limitations, risks, security and privacy concerns of performing a virtual visit and the availability of in person appointments. I also reviewed that there may be a patient responsible charge related to this service. The patient agreed to proceed.   Patient location: home Provider location: [place] [facility], office Persons participating in this virtual visit: patient, provider   If any vitals were documented, they were collected by patient at home unless specified below.    Ht 5' 9"$  (1.753 m)   Wt 183 lb (83 kg)   BMI 27.02 kg/m    CC: Cough Subjective:   HPI: Gregory Good is a 67 y.o. male presenting on 04/15/2022 for Cough (X2 days Congestion, cough, drainage, headache/No fever, body aches, chills/At home covid test yesterday was negative )  Symptom onset two evenings ago with head congestion and post nasal drip. He then developed cough, headaches, nasal congestion. He took a home Covid-19 test yesterday which was negative.  He denies fevers, chills, body aches. His wife and grandchildren had these symptoms last week. He's been taking Nyquil without much improvement. He is compliant to his Allegra. He's really not sleeping much and believes that if he could get some rest he'd feel better.      Relevant past medical, surgical, family and social history reviewed and updated as indicated. Interim medical history since our last visit reviewed. Allergies and medications reviewed and updated. Outpatient Medications Prior to Visit  Medication Sig Dispense Refill   esomeprazole (NEXIUM) 20 MG capsule TAKE 1 CAPSULE BY MOUTH TWICE DAILY BEFORE A MEAL 180 capsule  1   fexofenadine (ALLEGRA) 30 MG tablet Take 30 mg by mouth daily as needed (for allergies).      sildenafil (REVATIO) 20 MG tablet TAKE 2 TO 5 TABLETS BY MOUTH 60 MINUTES PRIOR TO INTERCOURSE 90 tablet 2   tadalafil (CIALIS) 5 MG tablet Take 5 mg by mouth daily.     testosterone cypionate (DEPOTESTOSTERONE CYPIONATE) 200 MG/ML injection Inject 0.5 ml into the muscle once weekly. 3 mL 0   No facility-administered medications prior to visit.     Per HPI unless specifically indicated in ROS section below Review of Systems  Constitutional:  Positive for fatigue. Negative for chills and fever.  HENT:  Positive for congestion and postnasal drip.   Respiratory:  Positive for cough.   Neurological:  Positive for headaches.   Objective:  Ht 5' 9"$  (1.753 m)   Wt 183 lb (83 kg)   BMI 27.02 kg/m   Wt Readings from Last 3 Encounters:  04/15/22 183 lb (83 kg)  09/28/21 183 lb (83 kg)  09/15/21 180 lb (81.6 kg)       Physical exam: General: Alert and oriented x 3, no distress, does appear sickly  Pulmonary: Speaks in complete sentences without increased work of breathing, no cough during visit.  Psychiatric: Normal mood, thought content, and behavior.     Results for orders placed or performed during the hospital encounter of 09/28/21  Surgical pathology  Result Value Ref Range   SURGICAL PATHOLOGY      SURGICAL PATHOLOGY CASE: [id number] PATIENT: Gregory Good Surgical Pathology Report     Specimen Submitted:  A. Ileocecal valve polyp; cold snare B. Colon polyp x4, rectosigmoid; cold snare  Clinical History: Screening Z12.11.  Barrett's esophagus.  K22.70. Irregular Z line, hiatal hernia, colon polyps    DIAGNOSIS: A. COLON POLYP, ILEOCECAL VALVE; COLD SNARE: - SESSILE SERRATED POLYP. - NEGATIVE FOR DYSPLASIA AND MALIGNANCY.  B. COLON POLYPS X 4, RECTOSIGMOID; COLD SNARE: - FRAGMENTS (X 2) OF TUBULAR ADENOMAS. - SINGLE FRAGMENT OF HYPERPLASTIC POLYP. - NEGATIVE  FOR HIGH-GRADE DYSPLASIA AND MALIGNANCY.  GROSS DESCRIPTION: A. Labeled: Cold snare ileocecal valve polyp Received: Formalin Collection time: 11:10 AM on 09/28/2021 Placed into formalin time: 11:10 AM on 09/28/2021 Tissue fragment(s): Multiple Size: Aggregate, 1.4 x 0.4 x 0.1 cm Description: Received are tan soft tissue fragments, admixed with intestinal debris.  The ratio of soft tissue to  intestinal debris is 90: 10. Entirely submitted in 1 cassette.  B. Labeled: Cold snare rectosigmoid colon polyp x 4 Received: Formalin Collection time: 11:15 AM on 09/28/2021 Placed into formalin time: 11:15 AM on 09/28/2021 Tissue fragment(s): Multiple Size: Aggregate, 1.6 x 0.5 x 0.1 cm Description: Received are tan soft tissue fragments, admixed with intestinal debris.  The ratio of soft tissue to intestinal debris is 70: 30. Entirely submitted in 1 cassette.  CM 09/28/2021  Final Diagnosis performed by Allena Napoleon, MD.   Electronically signed 09/29/2021 9:34:59AM The electronic signature indicates that the named Attending Pathologist has evaluated the specimen Technical component performed at [facility], [address], [place], [place] [address] Lab: [phone] Dir: Rush Farmer, MD, MMM  Professional component performed at [facility], [facility], [place], [place], [place] [address] Lab: [phone] Dir: Kathi Simpers, MD    Assessment & Plan:   Problem List Items Addressed This Visit       Respiratory   Viral URI with cough - Primary    Appears viral at this point. Stable for outpatient treatment.  Start Flonase BID. Continue Allegra daily.  Start Cheratussin AC TID PRN. Drowsiness precautions provided. He will update next week if no improvement.        Relevant Medications   guaiFENesin-codeine (ROBITUSSIN AC) 100-10 MG/5ML syrup     Meds ordered this encounter  Medications   guaiFENesin-codeine (ROBITUSSIN AC) 100-10 MG/5ML syrup    Sig:  Take 5 mLs by mouth 3 (three) times daily as needed for cough.    Dispense:  75 mL    Refill:  0    Order Specific Question:   Supervising Provider    Answer:   BEDSOLE, AMY E [[id number]]   No orders of the defined types were placed in this encounter.   I discussed the assessment and treatment plan with the patient. The patient was provided an opportunity to ask questions and all were answered. The patient agreed with the plan and demonstrated an understanding of the instructions. The patient was advised to call back or seek an in-person evaluation if the symptoms worsen or if the condition fails to improve as anticipated.  Follow up plan:  You may take the cough suppressant every 8 hours as needed for cough and rest. Caution this medication contains codeine which may cause drowsiness.   Resume Flonase.  Continue taking Allegra.  Please update me next week if not feeling better!    Pleas Koch, NP

## 2022-04-15 NOTE — Assessment & Plan Note (Signed)
Appears viral at this point. Stable for outpatient treatment.  Start Flonase BID. Continue Allegra daily.  Start Cheratussin AC TID PRN. Drowsiness precautions provided. He will update next week if no improvement.

## 2022-04-15 NOTE — Patient Instructions (Signed)
You may take the cough suppressant every 8 hours as needed for cough and rest. Caution this medication contains codeine which may cause drowsiness.   Resume Flonase.  Continue taking Allegra.  Please update me next week if not feeling better!

## 2022-05-18 ENCOUNTER — Other Ambulatory Visit: Payer: Self-pay | Admitting: Primary Care

## 2022-05-18 DIAGNOSIS — N529 Male erectile dysfunction, unspecified: Secondary | ICD-10-CM

## 2022-05-27 ENCOUNTER — Encounter: Payer: Self-pay | Admitting: Nurse Practitioner

## 2022-05-27 ENCOUNTER — Telehealth (INDEPENDENT_AMBULATORY_CARE_PROVIDER_SITE_OTHER): Payer: 59 | Admitting: Nurse Practitioner

## 2022-05-27 DIAGNOSIS — J989 Respiratory disorder, unspecified: Secondary | ICD-10-CM

## 2022-05-27 HISTORY — DX: Respiratory disorder, unspecified: J98.9

## 2022-05-27 MED ORDER — METHYLPREDNISOLONE 4 MG PO TBPK: ORAL_TABLET | ORAL | 0 refills | Status: DC

## 2022-05-27 MED ORDER — AMOXICILLIN-POT CLAVULANATE 875-125 MG PO TABS: 1.0000 | ORAL_TABLET | Freq: Two times a day (BID) | ORAL | 0 refills | Status: DC

## 2022-05-27 NOTE — Progress Notes (Signed)
MyChart Video Visit    Virtual Visit via Video Note   This visit type was conducted because this format is felt to be most appropriate for this patient at this time. Physical exam was limited by quality of the video and audio technology used for the visit. CMA was able to get the patient set up on a video visit.  Patient location: Home. Patient and provider in visit Provider location: Office  I discussed the limitations of evaluation and management by telemedicine and the availability of in person appointments. The patient expressed understanding and agreed to proceed.  Visit Date: 05/27/2022  Today's healthcare provider: Tomasita Morrow, NP     Subjective:    Patient ID: Gregory Good, male    DOB: October 08, 1955, 67 y.o.   MRN: [id number]  Chief Complaint  Patient presents with   Cough    Nasal drainage, 6 weeks, had a cold had predinsone helped but then it came back.  Coughing up phlegm   Has not tested recently for covid.   No chest pain  No headache No chills  No fever     HPI Respiratory illness:  Cough- Yes, productive  Congestion-   Sinus- Yes, nasal and facial pressure   Chest- No  Post nasal drip- Yes  Sore throat- No  Shortness of breath- No  Fever- No  Fatigue/Myalgia- No Headache- No Nausea/Vomiting- No Taste disturbance- No  Smell disturbance- No  Covid exposure- No  Covid vaccination- x 2  Flu vaccination- UTD  Medications- Allegra, Benadryl   Past Medical History:  Diagnosis Date   Barrett's esophagus    Bladder cancer ([place]) 2016   Follows with Urology   Bladder cancer [facility])    Chronic back pain    GERD (gastroesophageal reflux disease)    Hyperlipidemia    IBS (irritable bowel syndrome)     Past Surgical History:  Procedure Laterality Date   COLONOSCOPY WITH PROPOFOL N/A 09/28/2021   Procedure: COLONOSCOPY WITH PROPOFOL;  Surgeon: Lin Landsman, MD;  Location: [facility] ENDOSCOPY;  Service: Gastroenterology;  Laterality: N/A;    CYSTOSCOPY N/A 01/28/2015   Procedure: CYSTOSCOPY;  Surgeon: Irine Seal, MD;  Location: [facility];  Service: Urology;  Laterality: N/A;   ESOPHAGOGASTRODUODENOSCOPY N/A 09/28/2021   Procedure: ESOPHAGOGASTRODUODENOSCOPY (EGD);  Surgeon: Lin Landsman, MD;  Location: [facility] ENDOSCOPY;  Service: Gastroenterology;  Laterality: N/A;   left hip surgery     30 years ago for bone spur    right knee arthroscopy   2004   TONSILLECTOMY AND ADENOIDECTOMY     TRANSURETHRAL RESECTION OF BLADDER TUMOR N/A 01/28/2015   Procedure: TRANSURETHRAL RESECTION OF BLADDER TUMOR (TURBT) MITOMYCIN-C INSTILLATION;  Surgeon: Irine Seal, MD;  Location: [facility];  Service: Urology;  Laterality: N/A;    Family History  Problem Relation Age of Onset   Kidney cancer Father     Social History   Socioeconomic History   Marital status: Married    Spouse name: Not on file   Number of children: Not on file   Years of education: Not on file   Highest education level: Not on file  Occupational History   Not on file  Tobacco Use   Smoking status: Every Day    Packs/day: 1.00    Years: 45.00    Additional pack years: 0.00    Total pack years: 45.00    Types: Cigarettes   Smokeless tobacco: Never  Vaping Use   Vaping Use: Never used  Substance and Sexual  Activity   Alcohol use: Yes    Alcohol/week: 0.0 standard drinks of alcohol    Comment: very rarely   Drug use: No   Sexual activity: Yes  Other Topics Concern   Not on file  Social History Narrative   Married.    Works as Materials engineer.   Current smoker of 1-2 cigarettes per day.   Enjoys spending time with his family.   Social Determinants of Health   Financial Resource Strain: Not on file  Food Insecurity: Not on file  Transportation Needs: Not on file  Physical Activity: Not on file  Stress: Not on file  Social Connections: Not on file  Intimate Partner Violence: Not on file    Outpatient Medications Prior to Visit  Medication Sig Dispense Refill    esomeprazole (NEXIUM) 20 MG capsule TAKE 1 CAPSULE BY MOUTH TWICE DAILY BEFORE A MEAL 180 capsule 1   fexofenadine (ALLEGRA) 30 MG tablet Take 30 mg by mouth daily as needed (for allergies).      sildenafil (REVATIO) 20 MG tablet TAKE 2 TO 5 TABLETS BY MOUTH 60 MINUTES PRIOR TO INTERCOURSE 90 tablet 2   tadalafil (CIALIS) 5 MG tablet Take 5 mg by mouth daily.     testosterone cypionate (DEPOTESTOSTERONE CYPIONATE) 200 MG/ML injection Inject 0.5 ml into the muscle once weekly. 3 mL 0   guaiFENesin-codeine (ROBITUSSIN AC) 100-10 MG/5ML syrup Take 5 mLs by mouth 3 (three) times daily as needed for cough. (Patient not taking: Reported on 05/27/2022) 75 mL 0   No facility-administered medications prior to visit.    Allergies  Allergen Reactions   Zetia [Ezetimibe] Other (See Comments)    Joint pain     ROS See HPI     Objective:    Physical Exam  There were no vitals taken for this visit. Wt Readings from Last 3 Encounters:  04/15/22 183 lb (83 kg)  09/28/21 183 lb (83 kg)  09/15/21 180 lb (81.6 kg)   GENERAL: alert, oriented, appears well and in no acute distress   HEENT: atraumatic, conjunttiva clear, no obvious abnormalities on inspection of external nose and ears   NECK: normal movements of the head and neck   LUNGS: on inspection no signs of respiratory distress, breathing rate appears normal, no obvious gross SOB, gasping or wheezing   CV: no obvious cyanosis   MS: moves all visible extremities without noticeable abnormality   PSYCH/NEURO: pleasant and cooperative, no obvious depression or anxiety, speech and thought processing grossly intact    Assessment & Plan:   Problem List Items Addressed This Visit       Respiratory   Respiratory illness - Primary    Symptoms x 6 weeks. Has taken antihistamines and Robitussin only. Will treat with Augmentin 875 BID x 10 days and MDP. Advised adequate fluid intake. Encouraged to contact or seek in person care if symptoms  are not improving or are worsening.       Relevant Medications   amoxicillin-clavulanate (AUGMENTIN) 875-125 MG tablet   methylPREDNISolone (MEDROL DOSEPAK) 4 MG TBPK tablet    I am having Gregory Good start on amoxicillin-clavulanate and methylPREDNISolone. I am also having him maintain his fexofenadine, esomeprazole, testosterone cypionate, tadalafil, sildenafil, and guaiFENesin-codeine.  Meds ordered this encounter  Medications   amoxicillin-clavulanate (AUGMENTIN) 875-125 MG tablet    Sig: Take 1 tablet by mouth 2 (two) times daily.    Dispense:  20 tablet    Refill:  0    Order Specific  Question:   Supervising Provider    Answer:   Caryl Bis, ERIC G [[id number]]   methylPREDNISolone (MEDROL DOSEPAK) 4 MG TBPK tablet    Sig: Take as directed.    Dispense:  21 each    Refill:  0    Order Specific Question:   Supervising Provider    Answer:   Caryl Bis, ERIC G [[id number]]    I discussed the assessment and treatment plan with the patient. The patient was provided an opportunity to ask questions and all were answered. The patient agreed with the plan and demonstrated an understanding of the instructions.   The patient was advised to call back or seek an in-person evaluation if the symptoms worsen or if the condition fails to improve as anticipated.   Tomasita Morrow, NP [place] [facility] [phone] (phone) [phone] (fax)  [place]

## 2022-05-27 NOTE — Assessment & Plan Note (Addendum)
Symptoms x 6 weeks. Has taken antihistamines and Robitussin only. Will treat with Augmentin 875 BID x 10 days and MDP. Advised adequate fluid intake. Encouraged to contact or seek in person care if symptoms are not improving or are worsening.

## 2022-06-14 ENCOUNTER — Other Ambulatory Visit: Payer: Self-pay | Admitting: Primary Care

## 2022-06-14 DIAGNOSIS — N529 Male erectile dysfunction, unspecified: Secondary | ICD-10-CM

## 2022-06-14 NOTE — Telephone Encounter (Signed)
LVMTCB and sched

## 2022-06-14 NOTE — Telephone Encounter (Signed)
Patient is due for CPE/follow up in mid to late May, this will be required prior to any further refills.  Please schedule, thank you!   

## 2022-06-28 ENCOUNTER — Other Ambulatory Visit: Payer: Self-pay | Admitting: Primary Care

## 2022-06-28 DIAGNOSIS — N529 Male erectile dysfunction, unspecified: Secondary | ICD-10-CM

## 2022-06-28 NOTE — Telephone Encounter (Signed)
I sent refills for this on 06/14/22. Is he already out?

## 2022-06-28 NOTE — Telephone Encounter (Signed)
Unable to reach patient. Left voicemail to return call to our office.   

## 2022-06-29 NOTE — Telephone Encounter (Signed)
Unable to reach patient. Left voicemail to return call to our office.   

## 2022-07-01 NOTE — Telephone Encounter (Signed)
Unable to reach patient. Left voicemail to return call to our office.   

## 2022-07-02 NOTE — Telephone Encounter (Signed)
We have not been able to reach patient. Do you want [place] to send letter

## 2022-07-13 ENCOUNTER — Other Ambulatory Visit: Payer: Self-pay | Admitting: Primary Care

## 2022-07-13 DIAGNOSIS — N529 Male erectile dysfunction, unspecified: Secondary | ICD-10-CM

## 2022-07-15 ENCOUNTER — Ambulatory Visit (INDEPENDENT_AMBULATORY_CARE_PROVIDER_SITE_OTHER): Payer: 59 | Admitting: Primary Care

## 2022-07-15 ENCOUNTER — Encounter: Payer: Self-pay | Admitting: Primary Care

## 2022-07-15 VITALS — BP 122/78 | HR 75 | Temp 98.4°F | Ht 69.0 in | Wt 186.0 lb

## 2022-07-15 DIAGNOSIS — R3912 Poor urinary stream: Secondary | ICD-10-CM

## 2022-07-15 DIAGNOSIS — E785 Hyperlipidemia, unspecified: Secondary | ICD-10-CM

## 2022-07-15 DIAGNOSIS — Z8551 Personal history of malignant neoplasm of bladder: Secondary | ICD-10-CM | POA: Diagnosis not present

## 2022-07-15 DIAGNOSIS — K219 Gastro-esophageal reflux disease without esophagitis: Secondary | ICD-10-CM

## 2022-07-15 DIAGNOSIS — Z122 Encounter for screening for malignant neoplasm of respiratory organs: Secondary | ICD-10-CM

## 2022-07-15 DIAGNOSIS — Z23 Encounter for immunization: Secondary | ICD-10-CM | POA: Diagnosis not present

## 2022-07-15 DIAGNOSIS — F17200 Nicotine dependence, unspecified, uncomplicated: Secondary | ICD-10-CM

## 2022-07-15 DIAGNOSIS — Z0000 Encounter for general adult medical examination without abnormal findings: Secondary | ICD-10-CM | POA: Diagnosis not present

## 2022-07-15 DIAGNOSIS — E349 Endocrine disorder, unspecified: Secondary | ICD-10-CM

## 2022-07-15 DIAGNOSIS — M545 Low back pain, unspecified: Secondary | ICD-10-CM

## 2022-07-15 DIAGNOSIS — G8929 Other chronic pain: Secondary | ICD-10-CM

## 2022-07-15 DIAGNOSIS — N401 Enlarged prostate with lower urinary tract symptoms: Secondary | ICD-10-CM | POA: Diagnosis not present

## 2022-07-15 DIAGNOSIS — N529 Male erectile dysfunction, unspecified: Secondary | ICD-10-CM

## 2022-07-15 NOTE — Assessment & Plan Note (Signed)
Stable.  No concerns today. Continue to monitor.  

## 2022-07-15 NOTE — Patient Instructions (Signed)
Schedule a lab appointment to return fasting 4 hours.  You will either be contacted via phone regarding your referral for lung cancer screening, or you may receive a letter on your MyChart portal from our referral team with instructions for scheduling an appointment. Please let us know if you have not been contacted by anyone within two weeks.  It was a pleasure to see you today!

## 2022-07-15 NOTE — Progress Notes (Signed)
Subjective:    Patient ID: Gregory Good, male    DOB: [birth year], 67 y.o.   MRN: [id number]  HPI  Gregory Good is a very pleasant 67 y.o. male who presents today for complete physical and follow up of chronic conditions.  Immunizations: -Tetanus: Completed in 2019  -Shingles: Completed Shingrix series -Pneumonia: 2016 - Pneumovax  Diet: Fair diet.  Exercise: No regular exercise.  Eye exam: Completes annually  Dental exam: Completes semi-annually    Colonoscopy: Completed in 2023, due 2026 Lung Cancer Screening: Completed in 2021, never completed last year.   PSA: UTD, collected by Urology in February 2024  BP Readings from Last 3 Encounters:  07/15/22 122/78  09/28/21 106/64  09/15/21 125/85   The 10-year ASCVD risk score (Arnett DK, et al., 2019) is: 22.5%   Values used to calculate the score:     Age: 59 years     Sex: Male     Is Non-Hispanic African American: No     Diabetic: No     Tobacco smoker: Yes     Systolic Blood Pressure: 122 mmHg     Is BP treated: No     HDL Cholesterol: 26.7 mg/dL     Total Cholesterol: 197 mg/dL    Review of Systems  Constitutional:  Negative for unexpected weight change.  HENT:  Negative for rhinorrhea.   Respiratory:  Negative for cough and shortness of breath.   Cardiovascular:  Negative for chest pain.  Gastrointestinal:  Negative for constipation and diarrhea.  Genitourinary:  Negative for difficulty urinating.  Musculoskeletal:  Positive for arthralgias and back pain.  Skin:  Negative for rash.  Allergic/Immunologic: Negative for environmental allergies.  Neurological:  Negative for dizziness and headaches.  Psychiatric/Behavioral:  The patient is nervous/anxious.          Past Medical History:  Diagnosis Date   Barrett's esophagus    Bladder cancer ([facility]) 2016   Follows with Urology   Bladder cancer [facility])    Chronic back pain    GERD (gastroesophageal reflux disease)    Hyperlipidemia    IBS  (irritable bowel syndrome)    Respiratory illness 05/27/2022    Social History   Socioeconomic History   Marital status: Married    Spouse name: Not on file   Number of children: Not on file   Years of education: Not on file   Highest education level: Not on file  Occupational History   Not on file  Tobacco Use   Smoking status: Every Day    Packs/day: 1.00    Years: 45.00    Additional pack years: 0.00    Total pack years: 45.00    Types: Cigarettes   Smokeless tobacco: Never  Vaping Use   Vaping Use: Never used  Substance and Sexual Activity   Alcohol use: Yes    Alcohol/week: 0.0 standard drinks of alcohol    Comment: very rarely   Drug use: No   Sexual activity: Yes  Other Topics Concern   Not on file  Social History Narrative   Married.    Works as Psychiatric nurse.   Current smoker of 1-2 cigarettes per day.   Enjoys spending time with his family.   Social Determinants of Health   Financial Resource Strain: Not on file  Food Insecurity: Not on file  Transportation Needs: Not on file  Physical Activity: Not on file  Stress: Not on file  Social Connections: Not on file  Intimate Partner Violence: Not  on file    Past Surgical History:  Procedure Laterality Date   COLONOSCOPY WITH PROPOFOL N/A 09/28/2021   Procedure: COLONOSCOPY WITH PROPOFOL;  Surgeon: Toney Reil, MD;  Location: [facility] ENDOSCOPY;  Service: Gastroenterology;  Laterality: N/A;   CYSTOSCOPY N/A 01/28/2015   Procedure: CYSTOSCOPY;  Surgeon: Bjorn Pippin, MD;  Location: [facility];  Service: Urology;  Laterality: N/A;   ESOPHAGOGASTRODUODENOSCOPY N/A 09/28/2021   Procedure: ESOPHAGOGASTRODUODENOSCOPY (EGD);  Surgeon: Toney Reil, MD;  Location: [facility] ENDOSCOPY;  Service: Gastroenterology;  Laterality: N/A;   left hip surgery     30 years ago for bone spur    right knee arthroscopy   2004   TONSILLECTOMY AND ADENOIDECTOMY     TRANSURETHRAL RESECTION OF BLADDER TUMOR N/A 01/28/2015    Procedure: TRANSURETHRAL RESECTION OF BLADDER TUMOR (TURBT) MITOMYCIN-C INSTILLATION;  Surgeon: Bjorn Pippin, MD;  Location: [facility];  Service: Urology;  Laterality: N/A;    Family History  Problem Relation Age of Onset   Kidney cancer Father     Allergies  Allergen Reactions   Zetia [Ezetimibe] Other (See Comments)    Joint pain     Current Outpatient Medications on File Prior to Visit  Medication Sig Dispense Refill   esomeprazole (NEXIUM) 20 MG capsule TAKE 1 CAPSULE BY MOUTH TWICE DAILY BEFORE A MEAL 180 capsule 1   fexofenadine (ALLEGRA) 30 MG tablet Take 30 mg by mouth daily as needed (for allergies).      sildenafil (REVATIO) 20 MG tablet TAKE TWO TO FIVE TABLETS BY MOUTH 60 MINUTES PRIOR TO INTERCOURSE 90 tablet 0   tadalafil (CIALIS) 5 MG tablet Take 5 mg by mouth daily.     testosterone cypionate (DEPOTESTOSTERONE CYPIONATE) 200 MG/ML injection Inject 0.5 ml into the muscle once weekly. 3 mL 0   No current facility-administered medications on file prior to visit.    BP 122/78   Pulse 75   Temp 98.4 F (36.9 C) (Temporal)   Ht 5\' 9"  (1.753 m)   Wt 186 lb (84.4 kg)   SpO2 96%   BMI 27.47 kg/m  Objective:   Physical Exam HENT:     Right Ear: Tympanic membrane and ear canal normal.     Left Ear: Tympanic membrane and ear canal normal.     Nose: Nose normal.     Right Sinus: No maxillary sinus tenderness or frontal sinus tenderness.     Left Sinus: No maxillary sinus tenderness or frontal sinus tenderness.  Eyes:     Conjunctiva/sclera: Conjunctivae normal.  Neck:     Thyroid: No thyromegaly.     Vascular: No carotid bruit.  Cardiovascular:     Rate and Rhythm: Normal rate and regular rhythm.     Heart sounds: Normal heart sounds.  Pulmonary:     Effort: Pulmonary effort is normal.     Breath sounds: Normal breath sounds. No wheezing or rales.  Abdominal:     General: Bowel sounds are normal.     Palpations: Abdomen is soft.     Tenderness: There is no  abdominal tenderness.  Musculoskeletal:        General: Normal range of motion.     Cervical back: Neck supple.  Skin:    General: Skin is warm and dry.  Neurological:     Mental Status: He is alert and oriented to person, place, and time.     Cranial Nerves: No cranial nerve deficit.     Deep Tendon Reflexes: Reflexes are normal and  symmetric.  Psychiatric:        Mood and Affect: Mood normal.           Assessment & Plan:  Preventative health care Assessment & Plan: Prevnar 20 provided today. Colonoscopy UTD, due 2026 PSA UTD, reviewed Urology labs from February 2024 Referral placed for lung cancer screening  Discussed the importance of a healthy diet and regular exercise in order for weight loss, and to reduce the risk of further co-morbidity.  Exam stable. Labs pending.  Follow up in 1 year for repeat physical.    Benign prostatic hyperplasia with weak urinary stream Assessment & Plan: Controlled.  Continue Cialis 5 mg daily. Following with Urology.    History of bladder cancer Assessment & Plan: Following with urology, exam UTD.   Hyperlipidemia, unspecified hyperlipidemia type Assessment & Plan: Repeat lipid panel pending.  Discussed the importance of a healthy diet and regular exercise in order for weight loss, and to reduce the risk of further co-morbidity. Discussed his high risk for heart disease. He declines statin therapy despite recommendations.    The 10-year ASCVD risk score (Arnett DK, et al., 2019) is: 22.5%   Values used to calculate the score:     Age: 69 years     Sex: Male     Is Non-Hispanic African American: No     Diabetic: No     Tobacco smoker: Yes     Systolic Blood Pressure: 122 mmHg     Is BP treated: No     HDL Cholesterol: 26.7 mg/dL     Total Cholesterol: 197 mg/dL   Orders: -     Lipid panel; Future -     Comprehensive metabolic panel; Future  Gastroesophageal reflux disease, unspecified whether esophagitis  present Assessment & Plan: Controlled.  Continue Nexium 20 mg daily.   Chronic low back pain, unspecified back pain laterality, unspecified whether sciatica present Assessment & Plan: Stable.  No concerns today. Continue to monitor.    Erectile dysfunction, unspecified erectile dysfunction type Assessment & Plan: Controlled.  He is taking sildenafil 20 mg 2-3 times weekly, 100 mg at a time. Continue sildenafil 100 mg PRN.   Testosterone deficiency Assessment & Plan: Following with Urology. Continue testosterone 0.5 ml weekly.   Screening for lung cancer -     Ambulatory Referral for Lung Cancer Scre  Tobacco use disorder Assessment & Plan: Referral placed again for lung cancer screening         Doreene Nest, NP

## 2022-07-15 NOTE — Assessment & Plan Note (Signed)
Following with Urology. Continue testosterone 0.5 ml weekly.

## 2022-07-15 NOTE — Assessment & Plan Note (Signed)
Following with urology, exam UTD.

## 2022-07-15 NOTE — Assessment & Plan Note (Signed)
Controlled.  Continue Nexium 20 mg daily. 

## 2022-07-15 NOTE — Assessment & Plan Note (Signed)
Controlled.  Continue Cialis 5 mg daily. Following with Urology.

## 2022-07-15 NOTE — Assessment & Plan Note (Signed)
Referral placed again for lung cancer screening

## 2022-07-15 NOTE — Assessment & Plan Note (Signed)
Repeat lipid panel pending.  Discussed the importance of a healthy diet and regular exercise in order for weight loss, and to reduce the risk of further co-morbidity. Discussed his high risk for heart disease. He declines statin therapy despite recommendations.    The 10-year ASCVD risk score (Arnett DK, et al., 2019) is: 22.5%   Values used to calculate the score:     Age: 67 years     Sex: Male     Is Non-Hispanic African American: No     Diabetic: No     Tobacco smoker: Yes     Systolic Blood Pressure: 122 mmHg     Is BP treated: No     HDL Cholesterol: 26.7 mg/dL     Total Cholesterol: 197 mg/dL

## 2022-07-15 NOTE — Assessment & Plan Note (Signed)
Controlled.  He is taking sildenafil 20 mg 2-3 times weekly, 100 mg at a time. Continue sildenafil 100 mg PRN.

## 2022-07-15 NOTE — Assessment & Plan Note (Addendum)
Prevnar 20 provided today. Colonoscopy UTD, due 2026 PSA UTD, reviewed Urology labs from February 2024 Referral placed for lung cancer screening  Discussed the importance of a healthy diet and regular exercise in order for weight loss, and to reduce the risk of further co-morbidity.  Exam stable. Labs pending.  Follow up in 1 year for repeat physical.

## 2022-07-23 ENCOUNTER — Other Ambulatory Visit (INDEPENDENT_AMBULATORY_CARE_PROVIDER_SITE_OTHER): Payer: 59

## 2022-07-23 DIAGNOSIS — E785 Hyperlipidemia, unspecified: Secondary | ICD-10-CM

## 2022-07-23 LAB — LIPID PANEL
Cholesterol: 160 mg/dL (ref 0–200)
HDL: 25.4 mg/dL — ABNORMAL LOW (ref >39.00)
LDL Cholesterol: 100 mg/dL — ABNORMAL HIGH (ref 0–99)
NonHDL: 134.88
Total CHOL/HDL Ratio: 6
Triglycerides: 175 mg/dL — ABNORMAL HIGH (ref 0.0–149.0)
VLDL: 35 mg/dL (ref 0.0–40.0)

## 2022-07-23 LAB — COMPREHENSIVE METABOLIC PANEL
ALT: 15 U/L (ref 0–53)
AST: 15 U/L (ref 0–37)
Albumin: 4.3 g/dL (ref 3.5–5.2)
Alkaline Phosphatase: 43 U/L (ref 39–117)
BUN: 13 mg/dL (ref 6–23)
CO2: 29 mEq/L (ref 19–32)
Calcium: 9.2 mg/dL (ref 8.4–10.5)
Chloride: 104 mEq/L (ref 96–112)
Creatinine, Ser: 1.43 mg/dL (ref 0.40–1.50)
GFR: 51.38 mL/min — ABNORMAL LOW (ref >60.00)
Glucose, Bld: 94 mg/dL (ref 70–99)
Potassium: 4.2 mEq/L (ref 3.5–5.1)
Sodium: 140 mEq/L (ref 135–145)
Total Bilirubin: 0.5 mg/dL (ref 0.2–1.2)
Total Protein: 7.1 g/dL (ref 6.0–8.3)

## 2022-09-01 ENCOUNTER — Other Ambulatory Visit: Payer: Self-pay

## 2022-09-01 DIAGNOSIS — Z122 Encounter for screening for malignant neoplasm of respiratory organs: Secondary | ICD-10-CM

## 2022-09-01 DIAGNOSIS — Z87891 Personal history of nicotine dependence: Secondary | ICD-10-CM

## 2022-09-01 DIAGNOSIS — F17210 Nicotine dependence, cigarettes, uncomplicated: Secondary | ICD-10-CM

## 2022-09-21 ENCOUNTER — Telehealth: Payer: Self-pay | Admitting: Gastroenterology

## 2022-09-21 NOTE — Telephone Encounter (Signed)
Patient wife Gregory Good) called in to schedule her husband annual check up

## 2022-09-21 NOTE — Telephone Encounter (Signed)
Patient wife Sidonie Dickens) want to speak to the nurse she has a few questions about the annual check.

## 2022-09-21 NOTE — Telephone Encounter (Signed)
Patient wife is on patient DPR. Called Shaun and he just had  a colonoscopy done and has never been seen at our office before for anything for our office. Informed patient wife that if he needed a appointment with our office then he would need a referral from his PCP referring him to our office. Asked if he was having any GI issues and she said no. She said she was just making sure he did not need to be seen. Canceled appointment for 11/29/2022.

## 2022-10-29 ENCOUNTER — Ambulatory Visit: Admission: RE | Admit: 2022-10-29 | Payer: 59 | Source: Ambulatory Visit

## 2022-11-29 ENCOUNTER — Ambulatory Visit: Payer: 59 | Admitting: Gastroenterology

## 2023-02-09 ENCOUNTER — Other Ambulatory Visit: Payer: Self-pay | Admitting: Primary Care

## 2023-02-09 DIAGNOSIS — N529 Male erectile dysfunction, unspecified: Secondary | ICD-10-CM

## 2023-02-09 MED ORDER — SILDENAFIL CITRATE 20 MG PO TABS: ORAL_TABLET | ORAL | 0 refills | Status: DC

## 2023-02-09 NOTE — Telephone Encounter (Addendum)
Called and spoke with patients wife, patient needs a refill on sildenafil 20mg  tablets not Cialis.

## 2023-02-09 NOTE — Telephone Encounter (Signed)
Prescription Request  02/09/2023  LOV: 07/15/2022  What is the name of the medication or equipment? tadalafil (CIALIS) 5 MG tablet   Have you contacted your pharmacy to request a refill? No   Which pharmacy would you like this sent to?   [facility] - [place], [place] - [address] [place] [address] Phone: [phone] Fax: [phone]   Patient notified that their request is being sent to the clinical staff for review and that they should receive a response within 2 business days.   Please advise at Mobile [phone] (mobile)

## 2023-02-09 NOTE — Telephone Encounter (Signed)
Refills sent to pharmacy. 

## 2023-02-16 NOTE — Telephone Encounter (Signed)
Following up to see if this was resolved? I am not able to close the encounter from my end

## 2023-03-09 ENCOUNTER — Other Ambulatory Visit: Payer: Self-pay | Admitting: Primary Care

## 2023-03-09 DIAGNOSIS — N529 Male erectile dysfunction, unspecified: Secondary | ICD-10-CM

## 2023-05-10 ENCOUNTER — Telehealth: Payer: Self-pay | Admitting: Primary Care

## 2023-05-10 DIAGNOSIS — Z0100 Encounter for examination of eyes and vision without abnormal findings: Secondary | ICD-10-CM

## 2023-05-10 NOTE — Telephone Encounter (Signed)
 Copied from CRM [id number]. Topic: Referral - Request for Referral >> May 10, 2023  2:14 PM Elizebeth Brooking wrote: Did the patient discuss referral with their provider in the last year? Yes (If No - schedule appointment) (If Yes - send message)  Appointment offered? Yes  Type of order/referral and detailed reason for visit: OPTOMETRY  Preference of office, provider, location: - Would like to be sent where his wife was sent to   If referral order, have you been seen by this specialty before? No (If Yes, this issue or another issue? When? Where?  Can we respond through MyChart? Yes

## 2023-05-11 NOTE — Addendum Note (Signed)
 Addended by: Doreene Nest on: 05/11/2023 11:00 AM   Modules accepted: Orders

## 2023-05-11 NOTE — Telephone Encounter (Signed)
 Please call patient:  Let him know that I placed a referral for him to be seen at [facility] center. This is where his wife was referred.

## 2023-05-11 NOTE — Telephone Encounter (Signed)
 Unable to reach patient. Left voicemail to return call to our office.

## 2023-05-12 NOTE — Telephone Encounter (Signed)
 Unable to reach patient. Left voicemail to return call to our office.

## 2023-05-12 NOTE — Telephone Encounter (Signed)
 Copied from CRM [id number]. Topic: General - Other >> May 12, 2023  1:14 PM [place] J wrote: Reason for CRM: Patient returning missed call. I let patient know about the referral that was put in. Patient stated that they already contacted him.

## 2023-07-26 ENCOUNTER — Encounter: Admitting: Primary Care

## 2023-07-29 ENCOUNTER — Encounter: Payer: Self-pay | Admitting: Primary Care

## 2023-07-29 ENCOUNTER — Other Ambulatory Visit: Payer: Self-pay

## 2023-07-29 ENCOUNTER — Ambulatory Visit: Admitting: Primary Care

## 2023-07-29 ENCOUNTER — Ambulatory Visit: Payer: Self-pay | Admitting: Primary Care

## 2023-07-29 VITALS — BP 130/84 | HR 88 | Temp 98.1°F | Ht 69.0 in | Wt 191.0 lb

## 2023-07-29 DIAGNOSIS — G4700 Insomnia, unspecified: Secondary | ICD-10-CM

## 2023-07-29 DIAGNOSIS — Z122 Encounter for screening for malignant neoplasm of respiratory organs: Secondary | ICD-10-CM

## 2023-07-29 DIAGNOSIS — N289 Disorder of kidney and ureter, unspecified: Secondary | ICD-10-CM | POA: Diagnosis not present

## 2023-07-29 DIAGNOSIS — Z87891 Personal history of nicotine dependence: Secondary | ICD-10-CM

## 2023-07-29 DIAGNOSIS — R3912 Poor urinary stream: Secondary | ICD-10-CM

## 2023-07-29 DIAGNOSIS — N529 Male erectile dysfunction, unspecified: Secondary | ICD-10-CM

## 2023-07-29 DIAGNOSIS — K219 Gastro-esophageal reflux disease without esophagitis: Secondary | ICD-10-CM | POA: Diagnosis not present

## 2023-07-29 DIAGNOSIS — E349 Endocrine disorder, unspecified: Secondary | ICD-10-CM

## 2023-07-29 DIAGNOSIS — Z8551 Personal history of malignant neoplasm of bladder: Secondary | ICD-10-CM

## 2023-07-29 DIAGNOSIS — E785 Hyperlipidemia, unspecified: Secondary | ICD-10-CM

## 2023-07-29 DIAGNOSIS — N401 Enlarged prostate with lower urinary tract symptoms: Secondary | ICD-10-CM | POA: Diagnosis not present

## 2023-07-29 DIAGNOSIS — F17200 Nicotine dependence, unspecified, uncomplicated: Secondary | ICD-10-CM

## 2023-07-29 DIAGNOSIS — F17210 Nicotine dependence, cigarettes, uncomplicated: Secondary | ICD-10-CM

## 2023-07-29 DIAGNOSIS — Z0000 Encounter for general adult medical examination without abnormal findings: Secondary | ICD-10-CM

## 2023-07-29 DIAGNOSIS — B360 Pityriasis versicolor: Secondary | ICD-10-CM | POA: Insufficient documentation

## 2023-07-29 LAB — LIPID PANEL
Cholesterol: 181 mg/dL (ref 0–200)
HDL: 25 mg/dL — ABNORMAL LOW (ref >39.00)
LDL Cholesterol: 119 mg/dL — ABNORMAL HIGH (ref 0–99)
NonHDL: 156.29
Total CHOL/HDL Ratio: 7
Triglycerides: 187 mg/dL — ABNORMAL HIGH (ref 0.0–149.0)
VLDL: 37.4 mg/dL (ref 0.0–40.0)

## 2023-07-29 LAB — COMPREHENSIVE METABOLIC PANEL WITH GFR
ALT: 13 U/L (ref 0–53)
AST: 14 U/L (ref 0–37)
Albumin: 4.6 g/dL (ref 3.5–5.2)
Alkaline Phosphatase: 43 U/L (ref 39–117)
BUN: 13 mg/dL (ref 6–23)
CO2: 28 meq/L (ref 19–32)
Calcium: 9.4 mg/dL (ref 8.4–10.5)
Chloride: 100 meq/L (ref 96–112)
Creatinine, Ser: 1.54 mg/dL — ABNORMAL HIGH (ref 0.40–1.50)
GFR: 46.68 mL/min — ABNORMAL LOW (ref >60.00)
Glucose, Bld: 100 mg/dL — ABNORMAL HIGH (ref 70–99)
Potassium: 4.4 meq/L (ref 3.5–5.1)
Sodium: 136 meq/L (ref 135–145)
Total Bilirubin: 0.6 mg/dL (ref 0.2–1.2)
Total Protein: 7.2 g/dL (ref 6.0–8.3)

## 2023-07-29 MED ORDER — ESOMEPRAZOLE MAGNESIUM 20 MG PO CPDR: 20.0000 mg | DELAYED_RELEASE_CAPSULE | Freq: Two times a day (BID) | ORAL | 3 refills | Status: AC

## 2023-07-29 MED ORDER — SILDENAFIL CITRATE 20 MG PO TABS: ORAL_TABLET | ORAL | 5 refills | Status: DC

## 2023-07-29 MED ORDER — SELENIUM SULFIDE 2.5 % EX LOTN: 1.0000 | TOPICAL_LOTION | Freq: Every day | CUTANEOUS | 12 refills | Status: AC | PRN

## 2023-07-29 MED ORDER — TRAZODONE HCL 50 MG PO TABS: 50.0000 mg | ORAL_TABLET | Freq: Every day | ORAL | 0 refills | Status: DC

## 2023-07-29 NOTE — Assessment & Plan Note (Signed)
 Repeat lipid panel pending.

## 2023-07-29 NOTE — Assessment & Plan Note (Signed)
 Immunizations UTD. Colonoscopy UTD, due 2026 PSA UTD, follows with Urology Lung cancer screening overdue, referral placed.  Discussed the importance of a healthy diet and regular exercise in order for weight loss, and to reduce the risk of further co-morbidity.  Exam stable. Labs pending.  Follow up in 1 year for repeat physical.

## 2023-07-29 NOTE — Assessment & Plan Note (Signed)
 Following with Urology.  Continue Flomax 0.4 mg daily, Cialis  5 mg daily, sildenafil  100 mg PRN.

## 2023-07-29 NOTE — Assessment & Plan Note (Signed)
 Uncontrolled. Due to stress.   Start trazodone 50 mg at bedtime. He will update.

## 2023-07-29 NOTE — Patient Instructions (Signed)
 Stop by the lab prior to leaving today. I will notify you of your results once received.   You will receive a phone call regarding the lung cancer screening program.  You are due for your colonoscopy in 2026.  Start trazodone 50 mg at bedtime for sleep.  Please notify me if Nexium  is not covered by the insurance.  It was a pleasure to see you today!

## 2023-07-29 NOTE — Assessment & Plan Note (Signed)
 Waxes and wanes.  Refill provided for selenium sulfide lotion.

## 2023-07-29 NOTE — Assessment & Plan Note (Signed)
 Repeat renal function pending.  Consider renal US .

## 2023-07-29 NOTE — Assessment & Plan Note (Signed)
 Overall controlled.  Continue Nexium  20 mg BID. Continue famotidine 20 mg PRN.  Reviewed upper endoscopy from 2023

## 2023-07-29 NOTE — Assessment & Plan Note (Signed)
 In remission.  Follows with Urology.

## 2023-07-29 NOTE — Assessment & Plan Note (Signed)
 Stable.  Following with urology. Continue testosterone  100 mg weekly.

## 2023-07-29 NOTE — Assessment & Plan Note (Signed)
 Stable.  Following with Urology. Continue Cialis  5 mg daily, sildenafil  100 mg PRN.

## 2023-07-29 NOTE — Progress Notes (Signed)
 Subjective:    Patient ID: Gregory Good, male    DOB: September 05, 1955, 68 y.o.   MRN: [id number]  HPI  Gregory Good is a very pleasant 68 y.o. male who presents today for complete physical and follow up of chronic conditions.   His most bothersome concern is difficulty and staying asleep. He has difficulty falling asleep due to anxiety and mind racing thoughts. He wakes during the night around 3 am, cannot fall back asleep for a few hours, sometimes will fall back asleep. He tested negative for sleep apnea years ago. He's failed melatonin. He did not do well with Ambien, had terrible nightmares. During the weekends symptoms are somewhat better. His symptoms are largely related to stress. He denies worrying, feeling anxious.   Immunizations: -Tetanus: Completed in 2019 -Shingles: Completed Shingrix series -Pneumonia: Completed Prevnar 20 in 2024  Diet: Fair diet.  Exercise: No regular exercise.  Eye exam: Completes annually  Dental exam: Completes semi-annually    Colonoscopy: Completed in 2023, due 2026 Lung Cancer Screening: Completed in 2021, no show in 2024  PSA: UTD, follows with Urology   BP Readings from Last 3 Encounters:  07/29/23 130/84  07/15/22 122/78  09/28/21 106/64      Review of Systems  Constitutional:  Negative for unexpected weight change.  HENT:  Negative for rhinorrhea.   Respiratory:  Negative for cough and shortness of breath.   Cardiovascular:  Negative for chest pain.  Gastrointestinal:  Negative for constipation and diarrhea.  Genitourinary:  Negative for difficulty urinating.  Musculoskeletal:  Negative for arthralgias and myalgias.  Skin:  Negative for rash.  Allergic/Immunologic: Negative for environmental allergies.  Neurological:  Negative for dizziness, numbness and headaches.  Psychiatric/Behavioral:  Positive for sleep disturbance.          Past Medical History:  Diagnosis Date   Barrett's esophagus    Bladder cancer (HCC)  2016   Follows with Urology   Bladder cancer [facility])    Chronic back pain    GERD (gastroesophageal reflux disease)    Hyperlipidemia    IBS (irritable bowel syndrome)    Respiratory illness 05/27/2022    Social History   Socioeconomic History   Marital status: Married    Spouse name: Not on file   Number of children: Not on file   Years of education: Not on file   Highest education level: Not on file  Occupational History   Not on file  Tobacco Use   Smoking status: Every Day    Current packs/day: 1.00    Average packs/day: 1 pack/day for 45.0 years (45.0 ttl pk-yrs)    Types: Cigarettes   Smokeless tobacco: Never  Vaping Use   Vaping status: Never Used  Substance and Sexual Activity   Alcohol use: Yes    Alcohol/week: 0.0 standard drinks of alcohol    Comment: very rarely   Drug use: No   Sexual activity: Yes  Other Topics Concern   Not on file  Social History Narrative   Married.    Works as Psychiatric nurse.   Current smoker of 1-2 cigarettes per day.   Enjoys spending time with his family.   Social Drivers of Corporate investment banker Strain: Not on file  Food Insecurity: Not on file  Transportation Needs: Not on file  Physical Activity: Not on file  Stress: Not on file  Social Connections: Not on file  Intimate Partner Violence: Not on file    Past Surgical History:  Procedure  Laterality Date   COLONOSCOPY WITH PROPOFOL  N/A 09/28/2021   Procedure: COLONOSCOPY WITH PROPOFOL ;  Surgeon: Selena Daily, MD;  Location: [facility] ENDOSCOPY;  Service: Gastroenterology;  Laterality: N/A;   CYSTOSCOPY N/A 01/28/2015   Procedure: CYSTOSCOPY;  Surgeon: Homero Luster, MD;  Location: [facility] ORS;  Service: Urology;  Laterality: N/A;   ESOPHAGOGASTRODUODENOSCOPY N/A 09/28/2021   Procedure: ESOPHAGOGASTRODUODENOSCOPY (EGD);  Surgeon: Selena Daily, MD;  Location: [facility] ENDOSCOPY;  Service: Gastroenterology;  Laterality: N/A;   left hip surgery     30 years ago for bone spur     right knee arthroscopy   2004   TONSILLECTOMY AND ADENOIDECTOMY     TRANSURETHRAL RESECTION OF BLADDER TUMOR N/A 01/28/2015   Procedure: TRANSURETHRAL RESECTION OF BLADDER TUMOR (TURBT) MITOMYCIN -C INSTILLATION;  Surgeon: Homero Luster, MD;  Location: [facility] ORS;  Service: Urology;  Laterality: N/A;    Family History  Problem Relation Age of Onset   Kidney cancer Father     Allergies  Allergen Reactions   Zetia  [Ezetimibe ] Other (See Comments)    Joint pain     Current Outpatient Medications on File Prior to Visit  Medication Sig Dispense Refill   fexofenadine (ALLEGRA) 30 MG tablet Take 30 mg by mouth daily as needed (for allergies).      tadalafil  (CIALIS ) 5 MG tablet Take 5 mg by mouth daily.     Tamsulosin HCl (FLOMAX PO) Take by mouth.     testosterone  cypionate (DEPOTESTOSTERONE CYPIONATE) 200 MG/ML injection Inject 0.5 ml into the muscle once weekly. 3 mL 0   No current facility-administered medications on file prior to visit.    BP 130/84   Pulse 88   Temp 98.1 F (36.7 C) (Temporal)   Ht 5\' 9"  (1.753 m)   Wt 191 lb (86.6 kg)   SpO2 96%   BMI 28.21 kg/m  Objective:   Physical Exam HENT:     Right Ear: Tympanic membrane and ear canal normal.     Left Ear: Tympanic membrane and ear canal normal.  Eyes:     Pupils: Pupils are equal, round, and reactive to light.  Cardiovascular:     Rate and Rhythm: Normal rate and regular rhythm.  Pulmonary:     Effort: Pulmonary effort is normal.     Breath sounds: Normal breath sounds.  Abdominal:     General: Bowel sounds are normal.     Palpations: Abdomen is soft.     Tenderness: There is no abdominal tenderness.  Musculoskeletal:        General: Normal range of motion.     Cervical back: Neck supple.  Skin:    General: Skin is warm and dry.  Neurological:     Mental Status: He is alert and oriented to person, place, and time.     Cranial Nerves: No cranial nerve deficit.     Deep Tendon Reflexes:     Reflex  Scores:      Patellar reflexes are 2+ on the right side and 2+ on the left side. Psychiatric:        Mood and Affect: Mood normal.           Assessment & Plan:  Preventative health care Assessment & Plan: Immunizations UTD. Colonoscopy UTD, due 2026 PSA UTD, follows with Urology Lung cancer screening overdue, referral placed.  Discussed the importance of a healthy diet and regular exercise in order for weight loss, and to reduce the risk of further co-morbidity.  Exam stable. Labs pending.  Follow up in 1 year for repeat physical.    Gastroesophageal reflux disease -     Esomeprazole  Magnesium ; Take 1 capsule (20 mg total) by mouth 2 (two) times daily before a meal. for heartburn.  Dispense: 180 capsule; Refill: 3  Benign prostatic hyperplasia with weak urinary stream Assessment & Plan: Following with Urology.  Continue Flomax 0.4 mg daily, Cialis  5 mg daily, sildenafil  100 mg PRN.    Decreased renal function Assessment & Plan: Repeat renal function pending.  Consider renal US .   Screening for lung cancer -     Ambulatory Referral for Lung Cancer Scre  Erectile dysfunction, unspecified erectile dysfunction type Assessment & Plan: Stable.  Following with Urology. Continue Cialis  5 mg daily, sildenafil  100 mg PRN.  Orders: -     Sildenafil  Citrate; TAKE TWO TO FIVE TABLETS BY MOUTH 60 MINUTES PRIOR TO INTERCOURSE  Dispense: 90 tablet; Refill: 5  History of bladder cancer Assessment & Plan: In remission.  Follows with Urology.    Hyperlipidemia, unspecified hyperlipidemia type Assessment & Plan: Repeat lipid panel pending.  Orders: -     Lipid panel -     Comprehensive metabolic panel with GFR  Tinea versicolor Assessment & Plan: Waxes and wanes.  Refill provided for selenium sulfide lotion.   Orders: -     Selenium Sulfide; Apply 1 Application topically daily as needed for irritation.  Dispense: 118 mL; Refill: 12  Insomnia,  unspecified type Assessment & Plan: Uncontrolled. Due to stress.   Start trazodone 50 mg at bedtime. He will update.  Orders: -     traZODone HCl; Take 1 tablet (50 mg total) by mouth at bedtime. For sleep  Dispense: 90 tablet; Refill: 0  Gastroesophageal reflux disease, unspecified whether esophagitis present Assessment & Plan: Overall controlled.  Continue Nexium  20 mg BID. Continue famotidine 20 mg PRN.  Reviewed upper endoscopy from 2023   Testosterone  deficiency Assessment & Plan: Stable.  Following with urology. Continue testosterone  100 mg weekly.   Tobacco use disorder Assessment & Plan: Overdue for lung cancer screening. New referral placed.         Alayjah Boehringer K Shakiyla Kook, NP

## 2023-07-29 NOTE — Assessment & Plan Note (Signed)
 Overdue for lung cancer screening. New referral placed.

## 2023-08-08 ENCOUNTER — Ambulatory Visit
Admission: RE | Admit: 2023-08-08 | Discharge: 2023-08-08 | Disposition: A | Discharge status: Home / Self Care | Source: Ambulatory Visit | Attending: Acute Care | Admitting: Acute Care

## 2023-08-08 DIAGNOSIS — Z87891 Personal history of nicotine dependence: Secondary | ICD-10-CM | POA: Insufficient documentation

## 2023-08-08 DIAGNOSIS — F17210 Nicotine dependence, cigarettes, uncomplicated: Secondary | ICD-10-CM | POA: Insufficient documentation

## 2023-08-08 DIAGNOSIS — Z122 Encounter for screening for malignant neoplasm of respiratory organs: Secondary | ICD-10-CM | POA: Insufficient documentation

## 2023-08-23 ENCOUNTER — Other Ambulatory Visit: Payer: Self-pay | Admitting: Acute Care

## 2023-08-23 DIAGNOSIS — Z122 Encounter for screening for malignant neoplasm of respiratory organs: Secondary | ICD-10-CM

## 2023-08-23 DIAGNOSIS — F17210 Nicotine dependence, cigarettes, uncomplicated: Secondary | ICD-10-CM

## 2023-08-23 DIAGNOSIS — Z87891 Personal history of nicotine dependence: Secondary | ICD-10-CM

## 2023-10-19 ENCOUNTER — Other Ambulatory Visit: Payer: Self-pay | Admitting: Primary Care

## 2023-10-19 DIAGNOSIS — G4700 Insomnia, unspecified: Secondary | ICD-10-CM

## 2023-11-11 ENCOUNTER — Other Ambulatory Visit: Payer: Self-pay | Admitting: Primary Care

## 2023-11-11 DIAGNOSIS — K2270 Barrett's esophagus without dysplasia: Secondary | ICD-10-CM

## 2023-11-11 DIAGNOSIS — K219 Gastro-esophageal reflux disease without esophagitis: Secondary | ICD-10-CM

## 2023-12-06 ENCOUNTER — Other Ambulatory Visit: Payer: Self-pay | Admitting: Urology

## 2023-12-06 DIAGNOSIS — R972 Elevated prostate specific antigen [PSA]: Secondary | ICD-10-CM

## 2023-12-16 ENCOUNTER — Ambulatory Visit
Admission: RE | Admit: 2023-12-16 | Discharge: 2023-12-16 | Disposition: A | Source: Ambulatory Visit | Attending: Urology | Admitting: Urology

## 2023-12-16 DIAGNOSIS — R972 Elevated prostate specific antigen [PSA]: Secondary | ICD-10-CM

## 2023-12-16 MED ORDER — GADOPICLENOL 0.5 MMOL/ML IV SOLN
10.0000 mL | Freq: Once | INTRAVENOUS | Status: AC | PRN
  Administered 2023-12-16: 9 mL via INTRAVENOUS

## 2024-02-08 ENCOUNTER — Other Ambulatory Visit: Payer: Self-pay

## 2024-02-29 LAB — OPHTHALMOLOGY REPORT-SCANNED

## 2024-03-02 ENCOUNTER — Telehealth: Payer: Self-pay

## 2024-03-02 NOTE — Telephone Encounter (Signed)
 I called pt to introduce myself as  the Coordinator of the [facility]state [facility].   1. I confirmed with the patient he is aware of his referral to the clinic 1/9, arriving @ 800 am   2. I discussed the format of the clinic and the physicians he will be seeing that day.   3. I discussed where the clinic is located and how to contact me.   4. I confirmed his address and informed him I would be mailing a packet of information and forms to be completed. I asked him to bring them with him the day of his appointment.    He voiced understanding of the above. I asked him to call me if he has any questions or concerns regarding his appointments or the forms he needs to complete.

## 2024-03-07 ENCOUNTER — Other Ambulatory Visit: Payer: Self-pay | Admitting: Primary Care

## 2024-03-07 DIAGNOSIS — N529 Male erectile dysfunction, unspecified: Secondary | ICD-10-CM

## 2024-03-08 DIAGNOSIS — C61 Malignant neoplasm of prostate: Secondary | ICD-10-CM | POA: Insufficient documentation

## 2024-03-08 LAB — SURGICAL PATHOLOGY

## 2024-03-08 NOTE — Progress Notes (Signed)
" ° °                              Care Plan Summary  Name: Karthik Whittinghill  DOB: 23-Jan-1956   Your Medical Team:   Urologist -  Dr. Gretel Ferrara, [facility]  Radiation Oncologist - Dr. Donnice Barge, [facility]   Medical Oncologist - Dr. Pauletta Chihuahua, [facility]  Recommendations: Daily Radiation  Surgery   * These recommendations are based on information available as of todays consult.      Recommendations may change depending on the results of further tests or exams.    Next Steps: Consider all your options and contact Vertell, your nurse navigator, with any questions or treatment decision.   When appointments need to be scheduled, you will be contacted by [facility] and/or [facility].  Questions?  Please do not hesitate to call Vertell Pont, BSN, RN at [phone] with any questions or concerns.  Vertell is your Oncology Nurse Navigator and is available to assist you while youre receiving your medical care at [facility].  "

## 2024-03-08 NOTE — Progress Notes (Signed)
 [facility] CONSULT NOTE  Patient Care Team: Gretta Comer POUR, NP as PCP - General (Internal Medicine) Vertell Pont, RN as Oncology Nurse Navigator  ASSESSMENT & PLAN:  Gregory Good is a 69 y.o.male with history of NMIBC, hypogonadism, BPH, erectile dysfunction, hyperlipidemia being seen at [facility] for prostate cancer.  Primary urologist Dr. Norleen Seltzer.  Patient presented with elevated PSA.  MRI showed PI-RADS 4 lesion at the left posteromedial apex peripheral zone suspicious for high-grade carcinoma.  Prostate biopsy from 02/08/2024 showed 4 cores of GS 3+4 =7 GG2.  1 core 3+3.  Last PSA was 5.1.  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  PSA 5.1. favorable intermediate risk Treatment: to be determined  His case was discussed at tumor board with multiple specialists including radiation oncologist, urology oncologist, pathologist, radiologist. The patient was counseled on the natural history of prostate cancer and the standard treatment options that are available for prostate cancer.   For favorable intermediate risk, per NCCN guideline, in patients with > 10 years of life expectancy, active surveillance, RP or RT are all options resulted in excellent long term survival.  Patient will follow-up with radiation oncology or urologic oncology for definitive treatment.  He may follow-up with medical oncology as needed.  Assessment & Plan Prostatic adenocarcinoma (HCC) AS vs RP vs RT  Long-term follow up with Urology calcium (1000-1200 mg daily from food and supplements) and vitamin D3 (1000 IU daily) Healthy lifestyle to prevent diabetes and CV disease Weight-bearing exercises (30 minutes per day) Limit alcohol consumption and avoid smoking  All questions were answered. The patient knows to call the clinic with any problems, questions or concerns. No barriers to learning was detected.  Pauletta JAYSON Chihuahua, MD 1/9/20262:18 PM  CHIEF COMPLAINTS/PURPOSE OF CONSULTATION:   prostate cancer  HISTORY OF PRESENTING ILLNESS:  Gregory Good 69 y.o. male is here because of prostate cancer.  I have reviewed his chart and materials related to his cancer extensively and collaborated history with the patient. Summary of oncologic history is as follows:  Patient sees Dr. Seltzer for history of NMIBC, BPH and elevated PSA. MRI showed PI-RADS 4 lesion at the left posteromedial apex peripheral zone suspicious for high-grade carcinoma.  Prostate biopsy from 02/08/2024 showed 4 cores of GS 3+4 =7 GG2.  1 core 3+3.  Last PSA was 5.1.   Oncology History  Prostatic adenocarcinoma (HCC)  11/29/2023 Tumor Marker   PSA was 5.1.   12/16/2023 Imaging   MRI: 12 mm peripheral zone nodule in the left posteromedial apex, suspicious for high-grade carcinoma. PI-RADS 4 (v2.1): High (clinically significant cancer likely).   No evidence of extracapsular extension or pelvic metastatic disease.   02/08/2024 Pathology Results   Prostate biopsy showed 4 cores of GS 3+4 =7 GG2.  1 core 3+3.    03/08/2024 Initial Diagnosis   Prostatic adenocarcinoma [facility])     MEDICAL HISTORY:  Past Medical History:  Diagnosis Date   Barrett's esophagus    Bladder cancer (HCC) 2016   Follows with Urology   Bladder cancer [facility])    Cancer (HCC)    Chronic back pain    Elevated PSA    GERD (gastroesophageal reflux disease)    Hyperlipidemia    IBS (irritable bowel syndrome)    Prostate cancer (HCC)    Respiratory illness 05/27/2022    SURGICAL HISTORY: Past Surgical History:  Procedure Laterality Date   COLONOSCOPY WITH PROPOFOL  N/A 09/28/2021   Procedure: COLONOSCOPY WITH PROPOFOL ;  Surgeon: Unk,  Corinn Skiff, MD;  Location: [facility] ENDOSCOPY;  Service: Gastroenterology;  Laterality: N/A;   CYSTOSCOPY N/A 01/28/2015   Procedure: CYSTOSCOPY;  Surgeon: Norleen Seltzer, MD;  Location: [facility] ORS;  Service: Urology;  Laterality: N/A;   ESOPHAGOGASTRODUODENOSCOPY N/A 09/28/2021   Procedure:  ESOPHAGOGASTRODUODENOSCOPY (EGD);  Surgeon: Unk Corinn Skiff, MD;  Location: [facility] ENDOSCOPY;  Service: Gastroenterology;  Laterality: N/A;   left hip surgery     30 years ago for bone spur    lumbar spinal surgery  06/2017   right knee arthroscopy   03/01/2002   TONSILLECTOMY AND ADENOIDECTOMY     TRANSURETHRAL RESECTION OF BLADDER TUMOR N/A 01/28/2015   Procedure: TRANSURETHRAL RESECTION OF BLADDER TUMOR (TURBT) MITOMYCIN -C INSTILLATION;  Surgeon: Norleen Seltzer, MD;  Location: [facility] ORS;  Service: Urology;  Laterality: N/A;    SOCIAL HISTORY: Social History   Socioeconomic History   Marital status: Married    Spouse name: Not on file   Number of children: Not on file   Years of education: Not on file   Highest education level: Not on file  Occupational History   Not on file  Tobacco Use   Smoking status: Every Day    Current packs/day: 1.00    Average packs/day: 1 pack/day for 45.0 years (45.0 ttl pk-yrs)    Types: Cigarettes   Smokeless tobacco: Never  Vaping Use   Vaping status: Never Used  Substance and Sexual Activity   Alcohol use: Yes    Alcohol/week: 0.0 standard drinks of alcohol    Comment: very rarely   Drug use: No   Sexual activity: Yes  Other Topics Concern   Not on file  Social History Narrative   Married.    Works as Psychiatric Nurse.   Current smoker of 1-2 cigarettes per day.   Enjoys spending time with his family.   Social Drivers of Health   Tobacco Use: High Risk (03/09/2024)   Patient History    Smoking Tobacco Use: Every Day    Smokeless Tobacco Use: Never    Passive Exposure: Not on file  Financial Resource Strain: Not on file  Food Insecurity: No Food Insecurity (03/09/2024)   Epic    Worried About Programme Researcher, Broadcasting/film/video in the Last Year: Never true    Ran Out of Food in the Last Year: Never true  Transportation Needs: No Transportation Needs (03/09/2024)   Epic    Lack of Transportation (Medical): No    Lack of Transportation (Non-Medical): No   Physical Activity: Not on file  Stress: Not on file  Social Connections: Not on file  Intimate Partner Violence: Not At Risk (03/09/2024)   Epic    Fear of Current or Ex-Partner: No    Emotionally Abused: No    Physically Abused: No    Sexually Abused: No  Depression (PHQ2-9): Low Risk (03/09/2024)   Depression (PHQ2-9)    PHQ-2 Score: 0  Alcohol Screen: Not on file  Housing: Unknown (03/09/2024)   Epic    Unable to Pay for Housing in the Last Year: No    Number of Times Moved in the Last Year: Not on file    Homeless in the Last Year: No  Utilities: Not At Risk (03/09/2024)   Epic    Threatened with loss of utilities: No  Health Literacy: Not on file    FAMILY HISTORY: Family History  Problem Relation Age of Onset   Kidney cancer Mother    Basal cell carcinoma Mother    Kidney  cancer Father     ALLERGIES:  is allergic to dilaudid  [hydromorphone ] and zetia  [ezetimibe ].  MEDICATIONS:  Current Outpatient Medications  Medication Sig Dispense Refill   esomeprazole  (NEXIUM ) 20 MG capsule Take 1 capsule (20 mg total) by mouth 2 (two) times daily before a meal. for heartburn. 180 capsule 3   fexofenadine (ALLEGRA) 30 MG tablet Take 30 mg by mouth daily as needed (for allergies).      selenium  sulfide (SELSUN ) 2.5 % lotion Apply 1 Application topically daily as needed for irritation. 118 mL 12   sildenafil  (REVATIO ) 20 MG tablet TAKE TWO TO FIVE TABLETS BY MOUTH 60 MINUTES PRIOR TO INTERCOURSE 90 tablet 0   tadalafil  (CIALIS ) 5 MG tablet Take 5 mg by mouth daily.     Tamsulosin HCl (FLOMAX PO) Take by mouth.     testosterone  cypionate (DEPOTESTOSTERONE CYPIONATE) 200 MG/ML injection Inject 0.5 ml into the muscle once weekly. 3 mL 0   traZODone  (DESYREL ) 50 MG tablet TAKE ONE TABLET (50 MG TOTAL) BY MOUTH AT BEDTIME FOR SLEEP 90 tablet 2   No current facility-administered medications for this visit.    REVIEW OF SYSTEMS:   All relevant systems were reviewed with the patient and are  negative.  PHYSICAL EXAMINATION: ECOG PERFORMANCE STATUS: 0  GENERAL: alert, no distress and comfortable SKIN: skin color is normal, no jaundice LUNGS: Effort normal, no respiratory distress.    LABORATORY & PATHOLOGY DATA:  I have reviewed the results of labs, PSA and biopsy results related to his cancer.  RADIOGRAPHIC STUDIES: I have reviewed the radiological images related to his cancer during tumor board meeting.

## 2024-03-08 NOTE — Progress Notes (Signed)
 Rn spoke with patient and reviewed upcoming PMDC on 03/09/2024.

## 2024-03-09 ENCOUNTER — Encounter: Payer: Self-pay | Admitting: Radiation Oncology

## 2024-03-09 ENCOUNTER — Ambulatory Visit
Admission: RE | Admit: 2024-03-09 | Discharge: 2024-03-09 | Disposition: A | Source: Ambulatory Visit | Attending: Radiation Oncology | Admitting: Radiation Oncology

## 2024-03-09 ENCOUNTER — Inpatient Hospital Stay

## 2024-03-09 VITALS — BP 139/73 | HR 77 | Temp 97.8°F | Resp 18 | Ht 69.0 in | Wt 188.4 lb

## 2024-03-09 DIAGNOSIS — C61 Malignant neoplasm of prostate: Secondary | ICD-10-CM | POA: Diagnosis present

## 2024-03-09 DIAGNOSIS — E291 Testicular hypofunction: Secondary | ICD-10-CM | POA: Diagnosis not present

## 2024-03-09 DIAGNOSIS — F17210 Nicotine dependence, cigarettes, uncomplicated: Secondary | ICD-10-CM | POA: Insufficient documentation

## 2024-03-09 DIAGNOSIS — Z8051 Family history of malignant neoplasm of kidney: Secondary | ICD-10-CM | POA: Diagnosis not present

## 2024-03-09 DIAGNOSIS — Z8052 Family history of malignant neoplasm of bladder: Secondary | ICD-10-CM | POA: Insufficient documentation

## 2024-03-09 DIAGNOSIS — Z79899 Other long term (current) drug therapy: Secondary | ICD-10-CM | POA: Diagnosis not present

## 2024-03-09 DIAGNOSIS — Z808 Family history of malignant neoplasm of other organs or systems: Secondary | ICD-10-CM | POA: Insufficient documentation

## 2024-03-09 HISTORY — DX: Malignant neoplasm of prostate: C61

## 2024-03-09 HISTORY — DX: Elevated prostate specific antigen (PSA): R97.20

## 2024-03-09 HISTORY — DX: Malignant (primary) neoplasm, unspecified: C80.1

## 2024-03-09 NOTE — Assessment & Plan Note (Addendum)
 AS vs RP vs RT  Long-term follow up with Urology calcium (1000-1200 mg daily from food and supplements) and vitamin D3 (1000 IU daily) Healthy lifestyle to prevent diabetes and CV disease Weight-bearing exercises (30 minutes per day) Limit alcohol consumption and avoid smoking

## 2024-03-09 NOTE — Progress Notes (Signed)
 "  Radiation Oncology         [phone] ________________________________  Multidisciplinary Prostate Cancer Clinic  Initial Radiation Oncology Consultation  Name: Gregory Good MRN: [id number]  Date: 03/09/2024  DOB: 1955-03-09  RR:Rojmx, Comer POUR, NP  Watt Rush, MD   REFERRING PHYSICIAN: Watt Rush, MD  DIAGNOSIS: 69 y.o. gentleman with stage T1c adenocarcinoma of the prostate with a Gleason's score of 3+4 and a PSA of 5.1    ICD-10-CM   1. Malignant neoplasm of prostate (HCC)  C61     2. Prostatic adenocarcinoma (HCC)  C61       HISTORY OF PRESENT ILLNESS::Gregory Good is a 69 y.o. gentleman. He has a history of non-invasive urothelial carcinoma of the bladder, s/p TURBT in 12/2014 under the care of Dr. Watt and a partial course of BCG postoperatively with no evidence of recurrent disease to date. He also has a history of hypogonadism, for which he had been on testosterone  replacement therapy since at least 2017. He was noted to have an increase in his PSA in 2024, from his baseline in the 3 and under range, up to 6.49 in 01/2023. This fluctuated at 4.4 in 07/2023 and remained elevated at 5.1 on 11/29/23. This prompted further evaluation with a prostate MRI which was performed on 12/16/23 showing a 12 mm PI-RADS 4 lesion in the peripheral zone of the left posteromedial apex without evidence of extracapsular extension or pelvic metastatic disease. The patient proceeded to MRI fusion biopsy of the prostate on 02/08/24.  The prostate volume measured 77 cc.  Out of 13 core biopsies, 5 were positive, all on the left.  The maximum Gleason score was 3+4, and this was seen in the MRI ROI and in the left base, left base lateral and left mid. Additionally, a small focus of Gleason 3+3 was seen in the left mid lateral.  The patient reviewed the biopsy results with his urologist and he has kindly been referred today to the multidisciplinary prostate cancer clinic for presentation of  pathology and radiology studies in our conference for discussion of potential radiation treatment options and clinical evaluation.  PREVIOUS RADIATION THERAPY: No  PAST MEDICAL HISTORY:  has a past medical history of Barrett's esophagus, Bladder cancer (HCC) (2016), Bladder cancer (HCC), Cancer (HCC), Chronic back pain, Elevated PSA, GERD (gastroesophageal reflux disease), Hyperlipidemia, IBS (irritable bowel syndrome), Prostate cancer (HCC), and Respiratory illness (05/27/2022).    PAST SURGICAL HISTORY: Past Surgical History:  Procedure Laterality Date   COLONOSCOPY WITH PROPOFOL  N/A 09/28/2021   Procedure: COLONOSCOPY WITH PROPOFOL ;  Surgeon: Unk Corinn Skiff, MD;  Location: [facility] ENDOSCOPY;  Service: Gastroenterology;  Laterality: N/A;   CYSTOSCOPY N/A 01/28/2015   Procedure: CYSTOSCOPY;  Surgeon: Rush Watt, MD;  Location: [facility] ORS;  Service: Urology;  Laterality: N/A;   ESOPHAGOGASTRODUODENOSCOPY N/A 09/28/2021   Procedure: ESOPHAGOGASTRODUODENOSCOPY (EGD);  Surgeon: Unk Corinn Skiff, MD;  Location: [facility] ENDOSCOPY;  Service: Gastroenterology;  Laterality: N/A;   left hip surgery     30 years ago for bone spur    lumbar spinal surgery  06/2017   right knee arthroscopy   03/01/2002   TONSILLECTOMY AND ADENOIDECTOMY     TRANSURETHRAL RESECTION OF BLADDER TUMOR N/A 01/28/2015   Procedure: TRANSURETHRAL RESECTION OF BLADDER TUMOR (TURBT) MITOMYCIN -C INSTILLATION;  Surgeon: Rush Watt, MD;  Location: [facility] ORS;  Service: Urology;  Laterality: N/A;    FAMILY HISTORY: family history includes Basal cell carcinoma in his mother; Kidney cancer in his father and mother.  SOCIAL HISTORY:  reports that he has been smoking cigarettes. He has a 45 pack-year smoking history. He has never used smokeless tobacco. He reports current alcohol use. He reports that he does not use drugs.  ALLERGIES: Dilaudid  [hydromorphone ] and Zetia  [ezetimibe ]  MEDICATIONS:  Current Outpatient Medications  Medication  Sig Dispense Refill   esomeprazole  (NEXIUM ) 20 MG capsule Take 1 capsule (20 mg total) by mouth 2 (two) times daily before a meal. for heartburn. 180 capsule 3   fexofenadine (ALLEGRA) 30 MG tablet Take 30 mg by mouth daily as needed (for allergies).      selenium  sulfide (SELSUN ) 2.5 % lotion Apply 1 Application topically daily as needed for irritation. 118 mL 12   sildenafil  (REVATIO ) 20 MG tablet TAKE TWO TO FIVE TABLETS BY MOUTH 60 MINUTES PRIOR TO INTERCOURSE 90 tablet 0   tadalafil  (CIALIS ) 5 MG tablet Take 5 mg by mouth daily.     Tamsulosin HCl (FLOMAX PO) Take by mouth.     testosterone  cypionate (DEPOTESTOSTERONE CYPIONATE) 200 MG/ML injection Inject 0.5 ml into the muscle once weekly. 3 mL 0   traZODone  (DESYREL ) 50 MG tablet TAKE ONE TABLET (50 MG TOTAL) BY MOUTH AT BEDTIME FOR SLEEP 90 tablet 2   No current facility-administered medications for this encounter.    REVIEW OF SYSTEMS:  On review of systems, the patient reports that he is doing well overall. He denies any chest pain, shortness of breath, cough, fevers, chills, night sweats, unintended weight changes. He denies any bowel disturbances, and denies abdominal pain, nausea or vomiting. He denies any new musculoskeletal or joint aches or pains. His IPSS was 21, indicating severe urinary symptoms. His SHIM was 18, indicating he has mild erectile dysfunction. A complete review of systems is obtained and is otherwise negative.   PHYSICAL EXAM:  Wt Readings from Last 3 Encounters:  03/09/24 188 lb 6.4 oz (85.5 kg)  07/29/23 191 lb (86.6 kg)  07/15/22 186 lb (84.4 kg)   Temp Readings from Last 3 Encounters:  03/09/24 97.8 F (36.6 C) (Oral)  07/29/23 98.1 F (36.7 C) (Temporal)  07/15/22 98.4 F (36.9 C) (Temporal)   BP Readings from Last 3 Encounters:  03/09/24 139/73  07/29/23 130/84  07/15/22 122/78   Pulse Readings from Last 3 Encounters:  03/09/24 77  07/29/23 88  07/15/22 75    /10  In general this is a  well appearing Caucasian man in no acute distress. He's alert and oriented x4 and appropriate throughout the examination. Cardiopulmonary assessment is negative for acute distress and he exhibits normal effort.    KPS = 100  100 - Normal; no complaints; no evidence of disease. 90   - Able to carry on normal activity; minor signs or symptoms of disease. 80   - Normal activity with effort; some signs or symptoms of disease. 82   - Cares for self; unable to carry on normal activity or to do active work. 60   - Requires occasional assistance, but is able to care for most of his personal needs. 50   - Requires considerable assistance and frequent medical care. 40   - Disabled; requires special care and assistance. 30   - Severely disabled; hospital admission is indicated although death not imminent. 20   - Very sick; hospital admission necessary; active supportive treatment necessary. 10   - Moribund; fatal processes progressing rapidly. 0     - Dead  Karnofsky DA, Abelmann WH, Craver LS and Burchenal [facility] [phone])  The use of the nitrogen mustards in the palliative treatment of carcinoma: with particular reference to bronchogenic carcinoma Cancer 1 634-56   LABORATORY DATA:  Lab Results  Component Value Date   WBC 7.5 08/24/2021   HGB 13.7 08/24/2021   HCT 43.8 08/24/2021   MCV 79.6 (L) 08/24/2021   PLT 262 08/24/2021   Lab Results  Component Value Date   NA 136 07/29/2023   K 4.4 07/29/2023   CL 100 07/29/2023   CO2 28 07/29/2023   Lab Results  Component Value Date   ALT 13 07/29/2023   AST 14 07/29/2023   ALKPHOS 43 07/29/2023   BILITOT 0.6 07/29/2023     RADIOGRAPHY: No results found.    IMPRESSION/PLAN: 69 y.o. gentleman with Stage T1c adenocarcinoma of the prostate with a Gleason score of 3+4 and a PSA of 5.1.    We discussed the patient's workup and outlined the nature of prostate cancer in this setting. The patient's T stage, Gleason's score, and PSA put him into the  favorable intermediate risk group. Accordingly, he is eligible for a variety of potential treatment options including brachytherapy, 5.5 weeks of external radiation, or prostatectomy. We discussed the available radiation techniques, and focused on the details and logistics of delivery. The patient is not a candidate for brachytherapy with a prostate volume of 77 g and severe LUTS.  Therefore, we discussed and outlined the risks, benefits, short and long-term effects associated with daily external beam radiotherapy and compared and contrasted these with prostatectomy. We discussed the role of rectal spacer gel in reducing the rectal toxicity associated with radiotherapy. He appears to have a good understanding of his disease and our treatment recommendations which are of curative intent.  He was encouraged to ask questions that were answered to his/their stated satisfaction.  At the end of the conversation the patient is undecided between RALP versus daily external beam radiation and would like to take some additional time to consider his options.  We will share our discussion with Dr. Watt.  He has our contact information and will let us  know if he ultimately elects to proceed with the daily external beam radiation and we will proceed with treatment planning accordingly at that time.  We enjoyed meeting him today and look forward to continuing to participate in his care.  We personally spent 60 minutes in this encounter including chart review, reviewing radiological studies, meeting face-to-face with the patient, entering orders and completing documentation.    Sabra MICAEL Rusk, PA-C    Donnice Barge, MD  [facility]  Radiation Oncology Direct Dial: [phone]  Fax: [phone] No [email or web address]  Skype  [email or web address]   This document serves as a record of services personally performed by Donnice Barge, MD and Sabra Rusk, PA-C. It was created on their behalf by Izetta Neither, a trained  medical scribe. The creation of this record is based on the scribe's personal observations and the provider's statements to them. This document has been checked and approved by the attending provider.    "

## 2024-03-20 NOTE — Progress Notes (Signed)
 RN spoke with patient to follow up after recent [facility]DC visit.  Patient is leaning heavily towards daily radiation, however, does have a follow up with Dr. Watt tomorrow, 1/21.  Patient would like to see Dr. Watt before committing to decisions.  He is agreeable for me to follow up later this week to confirm treatment decision.

## 2024-03-22 ENCOUNTER — Other Ambulatory Visit: Payer: Self-pay | Admitting: Urology

## 2024-03-22 DIAGNOSIS — C61 Malignant neoplasm of prostate: Secondary | ICD-10-CM

## 2024-03-23 NOTE — Progress Notes (Signed)
 Patient meet with Dr. Watt on 03/21/24 and has confirmed he would like to proceed with 5.5 weeks of IMRT.    Patient is set up for fiducial marker's, hydrogel placement on 2/27 followed by CT Simulation on 3/2.   RN spoke with patient, reviewed next steps.  All questions answered.

## 2024-04-27 ENCOUNTER — Ambulatory Visit (HOSPITAL_COMMUNITY): Admit: 2024-04-27 | Admitting: Urology

## 2024-04-27 SURGERY — INSERTION, GOLD SEEDS
Anesthesia: Monitor Anesthesia Care

## 2024-04-30 ENCOUNTER — Ambulatory Visit: Admitting: Radiation Oncology
